# Patient Record
Sex: Female | Born: 1950 | Race: White | Hispanic: No | Marital: Married | Smoking: Former smoker
Health system: Southern US, Community
[De-identification: ages and names within clinical notes are randomized; demographics above are authoritative.]

## PROBLEM LIST (undated history)

## (undated) DIAGNOSIS — E119 Type 2 diabetes mellitus without complications: Secondary | ICD-10-CM

## (undated) DIAGNOSIS — R7303 Prediabetes: Secondary | ICD-10-CM

## (undated) HISTORY — DX: Prediabetes: R73.03

## (undated) HISTORY — PX: REPLACEMENT TOTAL KNEE BILATERAL: SUR1225

## (undated) HISTORY — PX: ABDOMINAL HYSTERECTOMY: SHX81

---

## 2004-07-10 ENCOUNTER — Ambulatory Visit: Payer: Self-pay | Admitting: Unknown Physician Specialty

## 2006-02-19 ENCOUNTER — Ambulatory Visit: Payer: Self-pay | Admitting: Unknown Physician Specialty

## 2009-09-13 ENCOUNTER — Ambulatory Visit: Payer: Self-pay | Admitting: Gastroenterology

## 2010-09-03 ENCOUNTER — Ambulatory Visit: Payer: Self-pay | Admitting: General Practice

## 2010-09-19 ENCOUNTER — Inpatient Hospital Stay: Payer: Self-pay | Admitting: General Practice

## 2011-01-10 ENCOUNTER — Ambulatory Visit: Payer: Self-pay | Admitting: Unknown Physician Specialty

## 2011-01-29 ENCOUNTER — Ambulatory Visit: Payer: Self-pay | Admitting: General Practice

## 2011-02-13 ENCOUNTER — Inpatient Hospital Stay: Payer: Self-pay | Admitting: General Practice

## 2011-11-22 IMAGING — CR DG KNEE 1-2V*L*
1 series · 2 of 2 positions shown · non-contrast
Comparison: none

REASON FOR EXAM: postop
COMMENTS:   Bedside (portable):Y

PROCEDURE:     DXR - DXR KNEE LEFT AP AND LATERAL  - February 13, 2011 [DATE]
RESULT:     The patient is status post left knee arthroplasty. Surgical
drains and skin staples are present. There is no evidence of postoperative
bone or hardware complication.

[Series 1: view not recorded · 0.17mm/px · 2 of 2 slices shown]
[im 1/2]
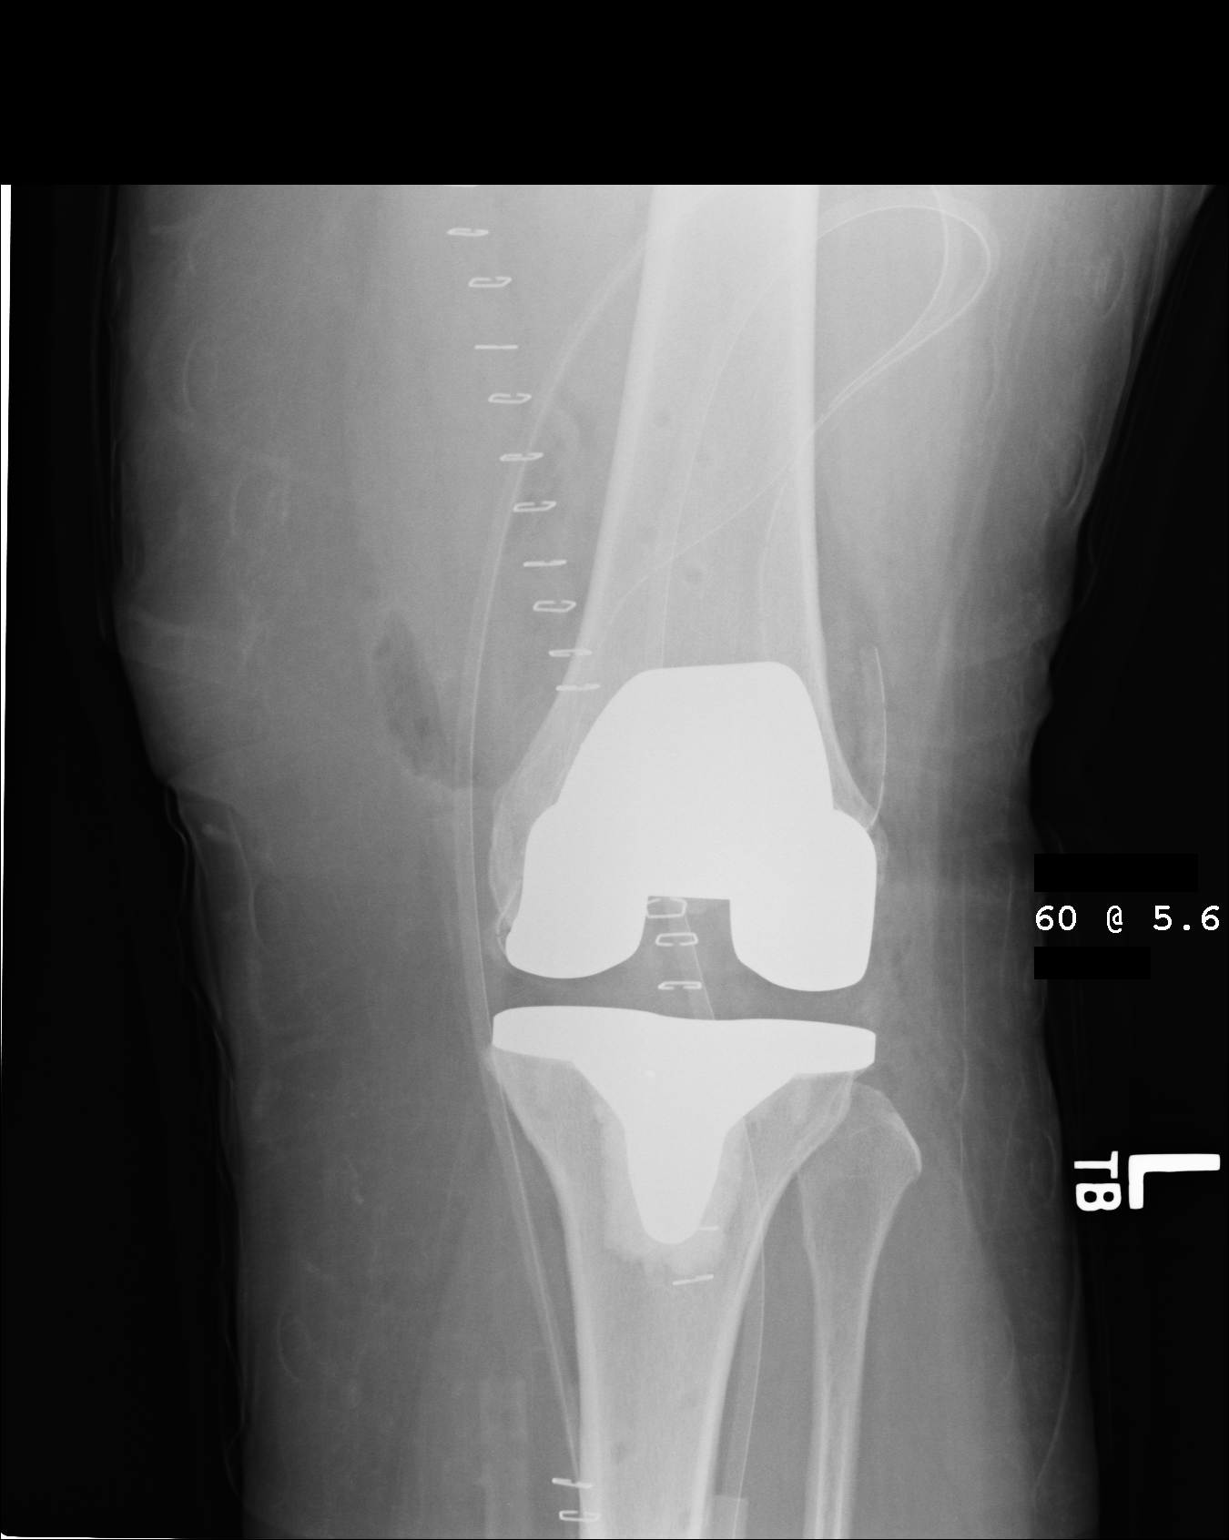
[im 2/2]
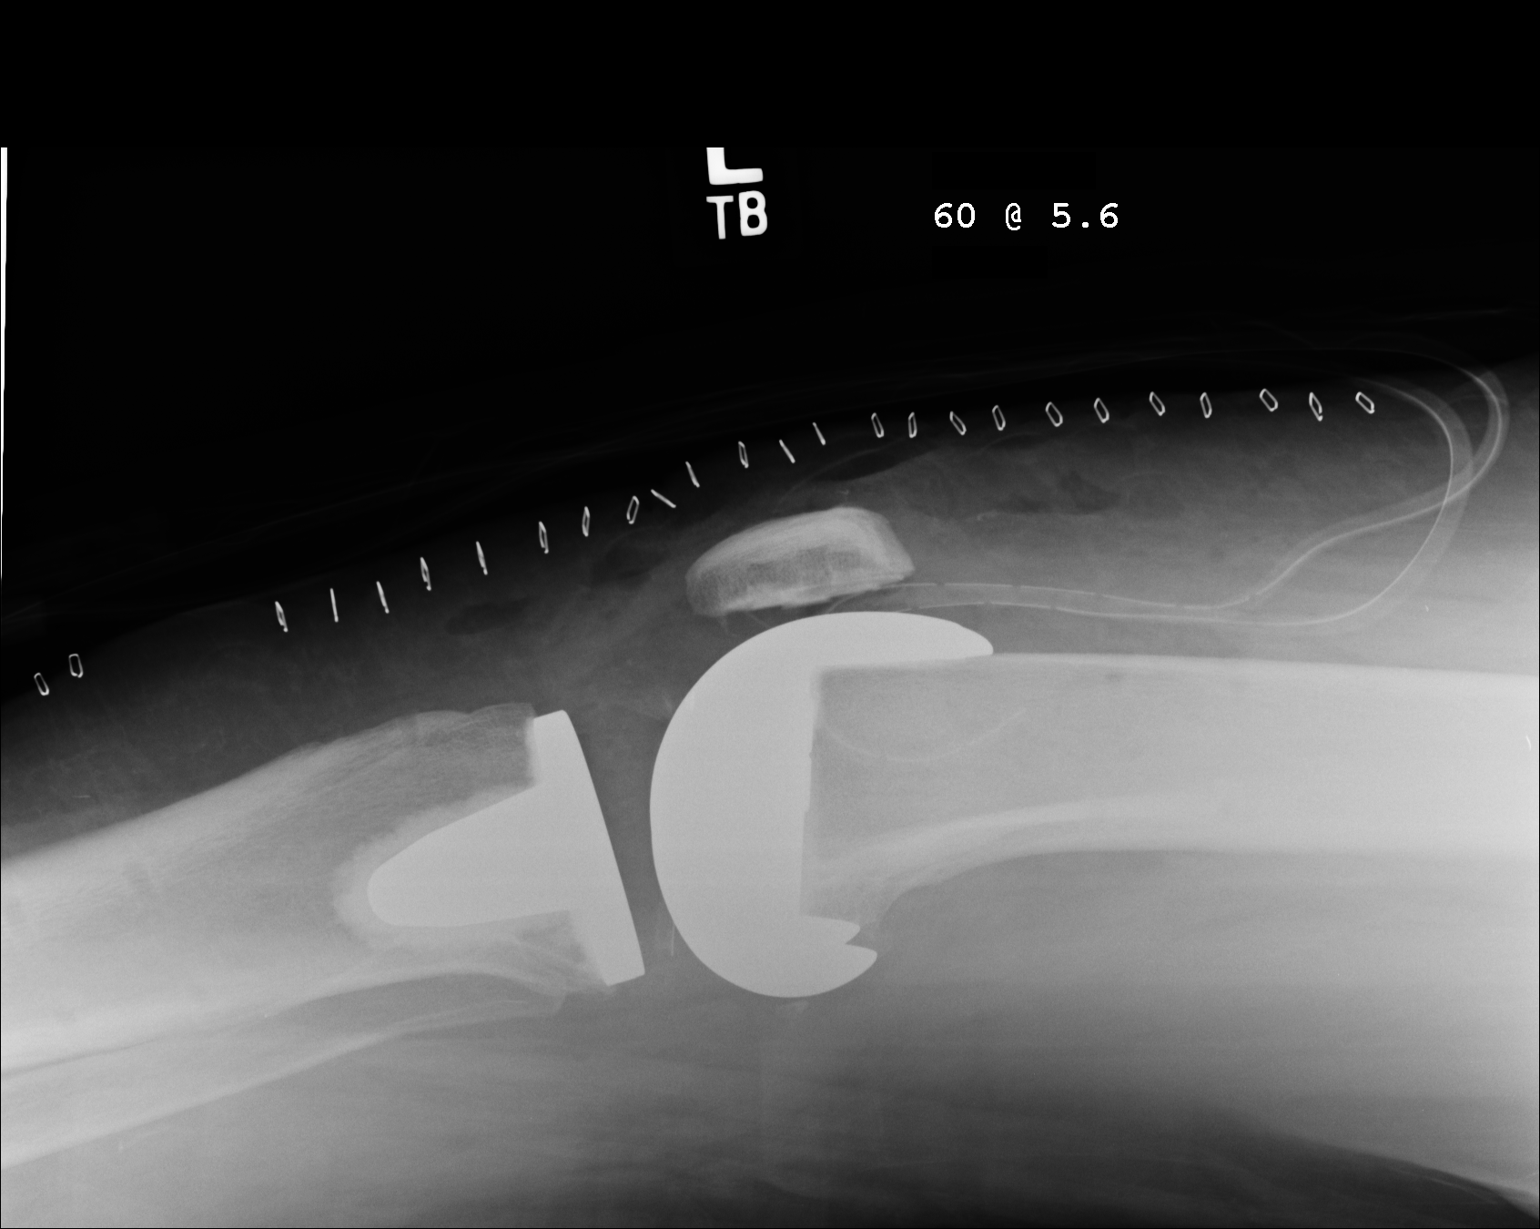

[2 of 2 positions shown; findings below may reference images not displayed]

IMPRESSION: Please see above.

## 2013-12-29 ENCOUNTER — Ambulatory Visit: Payer: Self-pay | Admitting: Family Medicine

## 2015-09-10 ENCOUNTER — Emergency Department: Payer: No Typology Code available for payment source

## 2015-09-10 ENCOUNTER — Inpatient Hospital Stay (HOSPITAL_COMMUNITY)
Admission: AD | Admit: 2015-09-10 | Discharge: 2015-09-14 | DRG: 083 | Disposition: A | Discharge status: Home Health | Payer: No Typology Code available for payment source | Source: Other Acute Inpatient Hospital | Attending: Neurosurgery | Admitting: Neurosurgery

## 2015-09-10 ENCOUNTER — Encounter: Payer: Self-pay | Admitting: Emergency Medicine

## 2015-09-10 ENCOUNTER — Emergency Department
Admission: EM | Admit: 2015-09-10 | Discharge: 2015-09-10 | Disposition: A | Discharge status: Home / Self Care | Payer: No Typology Code available for payment source | Attending: Emergency Medicine | Admitting: Emergency Medicine

## 2015-09-10 DIAGNOSIS — S069X1A Unspecified intracranial injury with loss of consciousness of 30 minutes or less, initial encounter: Secondary | ICD-10-CM

## 2015-09-10 DIAGNOSIS — N179 Acute kidney failure, unspecified: Secondary | ICD-10-CM | POA: Insufficient documentation

## 2015-09-10 DIAGNOSIS — W102XXA Fall (on)(from) incline, initial encounter: Secondary | ICD-10-CM | POA: Diagnosis present

## 2015-09-10 DIAGNOSIS — E119 Type 2 diabetes mellitus without complications: Secondary | ICD-10-CM | POA: Diagnosis present

## 2015-09-10 DIAGNOSIS — R001 Bradycardia, unspecified: Secondary | ICD-10-CM | POA: Diagnosis present

## 2015-09-10 DIAGNOSIS — S069XAA Unspecified intracranial injury with loss of consciousness status unknown, initial encounter: Secondary | ICD-10-CM | POA: Diagnosis present

## 2015-09-10 DIAGNOSIS — S06369A Traumatic hemorrhage of cerebrum, unspecified, with loss of consciousness of unspecified duration, initial encounter: Principal | ICD-10-CM | POA: Diagnosis present

## 2015-09-10 DIAGNOSIS — D72829 Elevated white blood cell count, unspecified: Secondary | ICD-10-CM | POA: Insufficient documentation

## 2015-09-10 DIAGNOSIS — R4701 Aphasia: Secondary | ICD-10-CM | POA: Diagnosis present

## 2015-09-10 DIAGNOSIS — Z96653 Presence of artificial knee joint, bilateral: Secondary | ICD-10-CM | POA: Diagnosis present

## 2015-09-10 DIAGNOSIS — E1169 Type 2 diabetes mellitus with other specified complication: Secondary | ICD-10-CM | POA: Insufficient documentation

## 2015-09-10 DIAGNOSIS — Z96651 Presence of right artificial knee joint: Secondary | ICD-10-CM | POA: Insufficient documentation

## 2015-09-10 DIAGNOSIS — Y999 Unspecified external cause status: Secondary | ICD-10-CM | POA: Insufficient documentation

## 2015-09-10 DIAGNOSIS — Z794 Long term (current) use of insulin: Secondary | ICD-10-CM | POA: Diagnosis not present

## 2015-09-10 DIAGNOSIS — Z7984 Long term (current) use of oral hypoglycemic drugs: Secondary | ICD-10-CM | POA: Diagnosis not present

## 2015-09-10 DIAGNOSIS — S0636AA Traumatic hemorrhage of cerebrum, unspecified, with loss of consciousness status unknown, initial encounter: Secondary | ICD-10-CM | POA: Insufficient documentation

## 2015-09-10 DIAGNOSIS — S069X9A Unspecified intracranial injury with loss of consciousness of unspecified duration, initial encounter: Secondary | ICD-10-CM

## 2015-09-10 DIAGNOSIS — Y939 Activity, unspecified: Secondary | ICD-10-CM | POA: Insufficient documentation

## 2015-09-10 DIAGNOSIS — S062X1A Diffuse traumatic brain injury with loss of consciousness of 30 minutes or less, initial encounter: Secondary | ICD-10-CM | POA: Diagnosis not present

## 2015-09-10 DIAGNOSIS — W108XXA Fall (on) (from) other stairs and steps, initial encounter: Secondary | ICD-10-CM | POA: Insufficient documentation

## 2015-09-10 DIAGNOSIS — Y92094 Garage of other non-institutional residence as the place of occurrence of the external cause: Secondary | ICD-10-CM | POA: Diagnosis not present

## 2015-09-10 DIAGNOSIS — Z96652 Presence of left artificial knee joint: Secondary | ICD-10-CM | POA: Diagnosis not present

## 2015-09-10 DIAGNOSIS — S06351D Traumatic hemorrhage of left cerebrum with loss of consciousness of 30 minutes or less, subsequent encounter: Secondary | ICD-10-CM | POA: Diagnosis not present

## 2015-09-10 DIAGNOSIS — E669 Obesity, unspecified: Secondary | ICD-10-CM | POA: Insufficient documentation

## 2015-09-10 DIAGNOSIS — I619 Nontraumatic intracerebral hemorrhage, unspecified: Secondary | ICD-10-CM

## 2015-09-10 HISTORY — DX: Type 2 diabetes mellitus without complications: E11.9

## 2015-09-10 LAB — MRSA PCR SCREENING: MRSA by PCR: NEGATIVE

## 2015-09-10 LAB — CBC
HEMATOCRIT: 38.1 % (ref 36.0–46.0)
HEMOGLOBIN: 12.6 g/dL (ref 12.0–15.0)
MCH: 26.5 pg (ref 26.0–34.0)
MCHC: 33.1 g/dL (ref 30.0–36.0)
MCV: 80.2 fL (ref 78.0–100.0)
Platelets: 250 10*3/uL (ref 150–400)
RBC: 4.75 MIL/uL (ref 3.87–5.11)
RDW: 14.7 % (ref 11.5–15.5)
WBC: 13.3 10*3/uL — ABNORMAL HIGH (ref 4.0–10.5)

## 2015-09-10 LAB — BASIC METABOLIC PANEL
ANION GAP: 16 — AB (ref 5–15)
BUN: 19 mg/dL (ref 6–20)
CALCIUM: 9.1 mg/dL (ref 8.9–10.3)
CO2: 21 mmol/L — AB (ref 22–32)
Chloride: 99 mmol/L — ABNORMAL LOW (ref 101–111)
Creatinine, Ser: 1.22 mg/dL — ABNORMAL HIGH (ref 0.44–1.00)
GFR, EST AFRICAN AMERICAN: 53 mL/min — AB (ref >60)
GFR, EST NON AFRICAN AMERICAN: 46 mL/min — AB (ref >60)
GLUCOSE: 170 mg/dL — AB (ref 65–99)
POTASSIUM: 3.5 mmol/L (ref 3.5–5.1)
Sodium: 136 mmol/L (ref 135–145)

## 2015-09-10 MED ORDER — ACETAMINOPHEN 325 MG PO TABS: 650.0000 mg | ORAL_TABLET | Freq: Four times a day (QID) | ORAL | Status: DC | PRN

## 2015-09-10 MED ORDER — POTASSIUM CHLORIDE IN NACL 20-0.9 MEQ/L-% IV SOLN
INTRAVENOUS | Status: DC
  Administered 2015-09-10 – 2015-09-11 (×2): via INTRAVENOUS
  Filled 2015-09-10 (×3): qty 1000

## 2015-09-10 MED ORDER — TETANUS-DIPHTH-ACELL PERTUSSIS 5-2.5-18.5 LF-MCG/0.5 IM SUSP
0.5000 mL | Freq: Once | INTRAMUSCULAR | Status: AC
  Administered 2015-09-10: 0.5 mL via INTRAMUSCULAR
  Filled 2015-09-10: qty 0.5

## 2015-09-10 MED ORDER — SODIUM CHLORIDE 0.9% FLUSH
3.0000 mL | Freq: Two times a day (BID) | INTRAVENOUS | Status: DC
  Administered 2015-09-10: 6 mL via INTRAVENOUS
  Administered 2015-09-11: 3 mL via INTRAVENOUS
  Administered 2015-09-12: 6 mL via INTRAVENOUS
  Administered 2015-09-12 – 2015-09-14 (×3): 3 mL via INTRAVENOUS

## 2015-09-10 MED ORDER — ACETAMINOPHEN 650 MG RE SUPP: 650.0000 mg | Freq: Four times a day (QID) | RECTAL | Status: DC | PRN

## 2015-09-10 MED ORDER — IBUPROFEN 800 MG PO TABS
800.0000 mg | ORAL_TABLET | Freq: Once | ORAL | Status: AC
  Administered 2015-09-10: 800 mg via ORAL
  Filled 2015-09-10: qty 1

## 2015-09-10 MED ORDER — LIDOCAINE-EPINEPHRINE (PF) 1 %-1:200000 IJ SOLN
INTRAMUSCULAR | Status: AC
  Administered 2015-09-10: 16:00:00
  Filled 2015-09-10: qty 30

## 2015-09-10 MED ORDER — CEFAZOLIN SODIUM 1-5 GM-% IV SOLN
1.0000 g | Freq: Once | INTRAVENOUS | Status: DC
  Filled 2015-09-10 (×2): qty 50

## 2015-09-10 MED ORDER — ONDANSETRON 4 MG PO TBDP
4.0000 mg | ORAL_TABLET | Freq: Once | ORAL | Status: AC
  Administered 2015-09-10: 4 mg via ORAL
  Filled 2015-09-10: qty 1

## 2015-09-10 MED ORDER — ONDANSETRON HCL 4 MG/2ML IJ SOLN: 4.0000 mg | Freq: Four times a day (QID) | INTRAMUSCULAR | Status: DC | PRN

## 2015-09-10 MED ORDER — HYDROCODONE-ACETAMINOPHEN 5-325 MG PO TABS
1.0000 | ORAL_TABLET | ORAL | Status: DC | PRN
  Administered 2015-09-11 – 2015-09-13 (×7): 1 via ORAL
  Filled 2015-09-10: qty 2
  Filled 2015-09-10 (×6): qty 1

## 2015-09-10 MED ORDER — OXYCODONE-ACETAMINOPHEN 5-325 MG PO TABS
1.0000 | ORAL_TABLET | Freq: Once | ORAL | Status: AC
  Administered 2015-09-10: 1 via ORAL
  Filled 2015-09-10: qty 1

## 2015-09-10 MED ORDER — LIDOCAINE-EPINEPHRINE (PF) 1 %-1:200000 IJ SOLN
30.0000 mL | Freq: Once | INTRAMUSCULAR | Status: AC
  Administered 2015-09-10: 30 mL
  Filled 2015-09-10: qty 30

## 2015-09-10 MED ORDER — HYDROMORPHONE HCL 1 MG/ML IJ SOLN: 0.5000 mg | INTRAMUSCULAR | Status: DC | PRN

## 2015-09-10 MED ORDER — ONDANSETRON HCL 4 MG PO TABS: 4.0000 mg | ORAL_TABLET | Freq: Four times a day (QID) | ORAL | Status: DC | PRN

## 2015-09-10 NOTE — ED Provider Notes (Signed)
[facility] Emergency Department Provider Note  ____________________________________________  Time seen: Approximately 3:20 PM  I have reviewed the triage vital signs and the nursing notes.   HISTORY  Chief Complaint Fall    HPI Anita Lee is a 65 y.o. female presents to the emergency room via EMS after a fall. She tripped going down stairs in her garage hitting her head on her car and injuring her shins. Her husband reported she then lost consciousness and fell backward hitting the back of her head. She has since the accident been alert without any atypical behaviors. She does report some dizziness, that is worse with movement. She has right jaw pain along with a headache. She also has pain to her shins. She denies any use of blood thinners.   Past Medical History  Diagnosis Date  . Diabetes mellitus without complication (HCC)     There are no active problems to display for this patient.   Past Surgical History  Procedure Laterality Date  . Replacement total knee bilateral      No current outpatient prescriptions on file.  Allergies Review of patient's allergies indicates no known allergies.  No family history on file.  Social History Social History  Substance Use Topics  . Smoking status: Never Smoker   . Smokeless tobacco: None  . Alcohol Use: No    Review of Systems Constitutional: No fever/chills Eyes: No visual changes. ENT: No sore throat. Cardiovascular: Denies chest pain. Respiratory: Denies shortness of breath. Musculoskeletal: Negative for back pain,She denies neck pain  Skin: per HPI Neurological: Negative for headaches, focal weakness or numbness. 10-point ROS otherwise negative.  ____________________________________________   PHYSICAL EXAM:  VITAL SIGNS: ED Triage Vitals  Enc Vitals Group     BP 09/10/15 1233 124/78 mmHg     Pulse Rate 09/10/15 1233 62     Resp 09/10/15 1233 18     Temp 09/10/15  1233 97.7 F (36.5 C)     Temp Source 09/10/15 1233 Oral     SpO2 09/10/15 1233 98 %     Weight 09/10/15 1233 185 lb (83.915 kg)     Height 09/10/15 1233 5\' 2"  (1.575 m)     Head Cir --      Peak Flow --      Pain Score 09/10/15 1235 7     Pain Loc --      Pain Edu? --      Excl. in GC? --     Constitutional: Alert and oriented. Well appearing and in no acute distress. Eyes: Conjunctivae are normal. PERRL. EOMI. Ears:  Clear with normal landmarks. No erythema. Head: Atraumatic. Nose: No congestion/rhinnorhea. Mouth/Throat: Mucous membranes are moist.  Oropharynx non-erythematous. No lesions. Neck:  Supple.  No adenopathy.  No cervical spine tenderness to palpation. Cardiovascular: Normal rate, regular rhythm. Grossly normal heart sounds.  Good peripheral circulation. Respiratory: Normal respiratory effort.  No retractions. Lungs CTAB. Gastrointestinal: Soft and nontender. No distention. Neurologic:  Normal speech and language. No gross focal neurologic deficits are appreciated. No gait instability. Skin:  1.5 cm laceration to the posterior scalp; she has a laceration to left shin that was not assessed prior to CT of the head. Also a superficial abrasion, laceration to the right shin.  Psychiatric: Mood and affect are normal. Speech and behavior are normal.  ____________________________________________   LABS (all labs ordered are listed, but only abnormal results are displayed)  Labs Reviewed - No data to display ____________________________________________  EKG  ____________________________________________  RADIOLOGY  CLINICAL DATA: Patient with traumatic head injury. Positive reported loss of consciousness. Patient tripped hit her head on the car. Laceration to the back of the head.  EXAM: CT HEAD WITHOUT CONTRAST  CT MAXILLOFACIAL WITHOUT CONTRAST  CT CERVICAL SPINE WITHOUT CONTRAST  TECHNIQUE: Multidetector CT imaging of the head, cervical spine,  and maxillofacial structures were performed using the standard protocol without intravenous contrast. Multiplanar CT image reconstructions of the cervical spine and maxillofacial structures were also generated.  COMPARISON: None.  FINDINGS: CT HEAD FINDINGS  There is a 3.3 x 2.3 cm acute hematoma within the left temporal lobe. Small focal high attenuation peripherally overlying the right posterior parietal lobe (image 19; series 2). No significant midline shift. Orbits are unremarkable. There is fluid within the mastoid air cells bilaterally, left-greater-than-right. Fluid within the maxillary sinuses bilaterally. Mucosal thickening involving the ethmoid air cells. Soft tissue swelling and hematoma formation overlying the right frontal calvarium and along the dorsal right calvarium. No underlying skull fracture.  CT MAXILLOFACIAL FINDINGS  Maxilla and mandible are intact. Nasal bone is intact. Zygomatic arches and pterygoid plates are intact. Fluid within the maxillary sinuses bilaterally. Opacification of the left-greater-than-right ethmoid air cells. No evidence for definite displaced skullbase fracture. Osseous demineralization of the skullbase.  CT CERVICAL SPINE FINDINGS  Reversal of the normal cervical lordosis. Grade 1 anterolisthesis of C3 on C4 likely secondary to degenerative facet disease at this level. C5-6 and C6-7 degenerative disc disease. Craniocervical junction is intact. Prevertebral soft tissues are unremarkable. Lung apices are clear. Visualized thyroid is unremarkable.  IMPRESSION: There is a 3.3 cm acute intraparenchymal hemorrhage within the left temporal lobe, potentially posttraumatic in etiology.  Small amount of high attenuation overlying the posterior right parietal lobe favored to represent a small amount of extra-axial blood products, potentially subarachnoid in etiology.  Opacification of the left-greater-than-right mastoid air  cells which is favored to be secondary to a chronic infectious or inflammatory process. No displaced skull fracture is visualized. Recommend clinical correlation to exclude the possibility of hemotympanum on the left. If there are blood products, this would be worrisome for nondisplaced skullbase fracture.  Degenerative changes of the cervical spine without evidence for acute fracture.  Critical Value/emergent results were called by telephone at the time of interpretation on 09/10/2015 at 2:46 pm to Dr. Ladona Ridgel, who verbally acknowledged these results.   Electronically Signed  By: Annia Belt M.D.  On: 09/10/2015 14:58 ____________________________________________   PROCEDURES  Procedure(s) performed: None  Critical Care performed: No  ____________________________________________   INITIAL IMPRESSION / ASSESSMENT AND PLAN / ED COURSE  Pertinent labs & imaging results that were available during my care of the patient were reviewed by me and considered in my medical decision making (see chart for details).  65 year old female who was brought to flex after a fall with head injury. After initial assessment, CT of the head was ordered showing an intraparenchymal bleed in the temporal lobe. At this point patient was transferred to [facility] side, room 25. ____________________________________________   FINAL CLINICAL IMPRESSION(S) / ED DIAGNOSES  Final diagnoses:  None      Ignacia Bayley, PA-C 09/10/15 1529  Jene Every, MD 09/10/15 1549

## 2015-09-10 NOTE — ED Provider Notes (Signed)
Note below was reviewed. Patient was seen and examined history is exactly the same she gave to me herself. Patient reports brief loss of consciousness she says she has a mild headache no neck pain and nothing else hurts except for her shins bilaterally where she has some lacerations. She has some bleeding on the back of her head. On examination there is a one-inch laceration over the occiput this was anesthetized with 1% lidocaine skin was cleaned with Betadine wound was irrigated no foreign bodies were seen was closed with 4 staples. Bilateral tib-fib x-rays were done that showed no fractures there is some small amount of what appears to be tiny grits foreign bodies right of the skin these wounds were anesthetized with 1% lidocaine skin was cleaned with Betadine wound was irrigated with copious amounts of saline and some small gritty material was removed from the left laceration which is about 2 inches long and down to the bone in 1 spot wound was reirrigated deep tissues were closed with 4 stitches of 3-0 nylon undyed antibacterial and then the skin was closed with 5 stitches of 3-0 nylon black patient tolerated well the right leg was also anesthetized with 1% lidocaine with epi skin was cleaned wound was irrigated I did not see anything more than one little piece of foreign body in that wound was reviewed that was removed was reirrigated again and closed with 1 stitch of 3-0 nylon some superficial skin tears will be closed by the nurse with some Steri-Strips these were also cleaned and irrigated. Patient tolerated all this very well ambulance is on the way to take the patient to [facility]surgery [facility] where she has been admitted should say patient had no other head tenderness and tenderness chest tenderness abdominal tenderness arm tenderness or leg tenderness except for the lacerations. Was able to move all Achilles equally and well even after the wounds were repaired. Patient is currently stable awaiting  transport.  Arnaldo NatalPaul F Malinda, MD 09/10/15 [phone]

## 2015-09-10 NOTE — ED Notes (Signed)
EMS reports cannot transport pt with antibiotic.

## 2015-09-10 NOTE — ED Notes (Signed)
See informed PA that she fainted after hitting head on car  Questionable LOC

## 2015-09-10 NOTE — H&P (Signed)
Anita ArbourDebra Corenne Lee is an 65 y.o. female.   Chief Complaint: Traumatic brain injury HPI: 65 year old female fell while going upstairs striking her for head. Patient helped to her feet when she then fell backwards striking the back of her head. None of these falls cause a loss of consciousness however the patient was stunned and not acting appropriately. Patient taken to local emergency room. Head CT scan demonstrated a left posterior temporal hemorrhage. Patient transferred for additional care. No history of seizure. No history of numbness, paresthesias or weakness.  Past Medical History  Diagnosis Date  . Diabetes mellitus without complication [facility](HCC)     Past Surgical History  Procedure Laterality Date  . Replacement total knee bilateral      No family history on file. Social History:  reports that she has never smoked. She does not have any smokeless tobacco history on file. She reports that she does not drink alcohol. Her drug history is not on file.  Allergies: No Known Allergies  No prescriptions prior to admission    No results found for this or any previous visit (from the past 48 hour(s)). Dg Tibia/fibula Left  09/10/2015  CLINICAL DATA:  Patient status post fall in the dry age. Lower extremity pain. EXAM: LEFT TIBIA AND FIBULA - 2 VIEW COMPARISON:  Left knee radiograph 02/13/2011 FINDINGS: Patient status post left knee arthroplasty. Hardware appears intact. Normal anatomic alignment. No evidence for acute fracture or dislocation. IMPRESSION: No acute osseous abnormality. Electronically Signed   By: Annia Beltrew  Davis M.D.   On: 09/10/2015 15:45   Dg Tibia/fibula Right  09/10/2015  CLINICAL DATA:  Fall with bilateral lower extremity pain and lacerations EXAM: RIGHT TIBIA AND FIBULA - 2 VIEW COMPARISON:  09/19/2010 right knee radiograph FINDINGS: Status post right total knee arthroplasty, with well-positioned right distal femoral and right proximal tibial prostheses, with no evidence  of hardware fracture or loosening. Small soft tissue laceration anterior to the right proximal tibial shaft. No radiopaque foreign body. No osseous fracture or focal osseous lesion. IMPRESSION: 1. Small soft tissue laceration anterior to the proximal right tibial shaft. No osseous fracture. 2. Right total knee arthroplasty, with no hardware complication. Electronically Signed   By: Delbert PhenixJason A Poff M.D.   On: 09/10/2015 15:47   Ct Head Wo Contrast  09/10/2015  CLINICAL DATA:  Patient with traumatic head injury. Positive reported loss of consciousness. Patient tripped hit her head on the car. Laceration to the back of the head. EXAM: CT HEAD WITHOUT CONTRAST CT MAXILLOFACIAL WITHOUT CONTRAST CT CERVICAL SPINE WITHOUT CONTRAST TECHNIQUE: Multidetector CT imaging of the head, cervical spine, and maxillofacial structures were performed using the standard protocol without intravenous contrast. Multiplanar CT image reconstructions of the cervical spine and maxillofacial structures were also generated. COMPARISON:  None. FINDINGS: CT HEAD FINDINGS There is a 3.3 x 2.3 cm acute hematoma within the left temporal lobe. Small focal high attenuation peripherally overlying the right posterior parietal lobe (image 19; series 2). No significant midline shift. Orbits are unremarkable. There is fluid within the mastoid air cells bilaterally, left-greater-than-right. Fluid within the maxillary sinuses bilaterally. Mucosal thickening involving the ethmoid air cells. Soft tissue swelling and hematoma formation overlying the right frontal calvarium and along the dorsal right calvarium. No underlying skull fracture. CT MAXILLOFACIAL FINDINGS Maxilla and mandible are intact. Nasal bone is intact. Zygomatic arches and pterygoid plates are intact. Fluid within the maxillary sinuses bilaterally. Opacification of the left-greater-than-right ethmoid air cells. No evidence for definite displaced skullbase fracture. Osseous  demineralization of  the skullbase. CT CERVICAL SPINE FINDINGS Reversal of the normal cervical lordosis. Grade 1 anterolisthesis of C3 on C4 likely secondary to degenerative facet disease at this level. C5-6 and C6-7 degenerative disc disease. Craniocervical junction is intact. Prevertebral soft tissues are unremarkable. Lung apices are clear. Visualized thyroid is unremarkable. IMPRESSION: There is a 3.3 cm acute intraparenchymal hemorrhage within the left temporal lobe, potentially posttraumatic in etiology. Small amount of high attenuation overlying the posterior right parietal lobe favored to represent a small amount of extra-axial blood products, potentially subarachnoid in etiology. Opacification of the left-greater-than-right mastoid air cells which is favored to be secondary to a chronic infectious or inflammatory process. No displaced skull fracture is visualized. Recommend clinical correlation to exclude the possibility of hemotympanum on the left. If there are blood products, this would be worrisome for nondisplaced skullbase fracture. Degenerative changes of the cervical spine without evidence for acute fracture. Critical Value/emergent results were called by telephone at the time of interpretation on 09/10/2015 at 2:46 pm to Dr. Ladona Ridgel, who verbally acknowledged these results. Electronically Signed   By: Annia Belt M.D.   On: 09/10/2015 14:58   Ct Cervical Spine Wo Contrast  09/10/2015  CLINICAL DATA:  Patient with traumatic head injury. Positive reported loss of consciousness. Patient tripped hit her head on the car. Laceration to the back of the head. EXAM: CT HEAD WITHOUT CONTRAST CT MAXILLOFACIAL WITHOUT CONTRAST CT CERVICAL SPINE WITHOUT CONTRAST TECHNIQUE: Multidetector CT imaging of the head, cervical spine, and maxillofacial structures were performed using the standard protocol without intravenous contrast. Multiplanar CT image reconstructions of the cervical spine and maxillofacial structures were also  generated. COMPARISON:  None. FINDINGS: CT HEAD FINDINGS There is a 3.3 x 2.3 cm acute hematoma within the left temporal lobe. Small focal high attenuation peripherally overlying the right posterior parietal lobe (image 19; series 2). No significant midline shift. Orbits are unremarkable. There is fluid within the mastoid air cells bilaterally, left-greater-than-right. Fluid within the maxillary sinuses bilaterally. Mucosal thickening involving the ethmoid air cells. Soft tissue swelling and hematoma formation overlying the right frontal calvarium and along the dorsal right calvarium. No underlying skull fracture. CT MAXILLOFACIAL FINDINGS Maxilla and mandible are intact. Nasal bone is intact. Zygomatic arches and pterygoid plates are intact. Fluid within the maxillary sinuses bilaterally. Opacification of the left-greater-than-right ethmoid air cells. No evidence for definite displaced skullbase fracture. Osseous demineralization of the skullbase. CT CERVICAL SPINE FINDINGS Reversal of the normal cervical lordosis. Grade 1 anterolisthesis of C3 on C4 likely secondary to degenerative facet disease at this level. C5-6 and C6-7 degenerative disc disease. Craniocervical junction is intact. Prevertebral soft tissues are unremarkable. Lung apices are clear. Visualized thyroid is unremarkable. IMPRESSION: There is a 3.3 cm acute intraparenchymal hemorrhage within the left temporal lobe, potentially posttraumatic in etiology. Small amount of high attenuation overlying the posterior right parietal lobe favored to represent a small amount of extra-axial blood products, potentially subarachnoid in etiology. Opacification of the left-greater-than-right mastoid air cells which is favored to be secondary to a chronic infectious or inflammatory process. No displaced skull fracture is visualized. Recommend clinical correlation to exclude the possibility of hemotympanum on the left. If there are blood products, this would be  worrisome for nondisplaced skullbase fracture. Degenerative changes of the cervical spine without evidence for acute fracture. Critical Value/emergent results were called by telephone at the time of interpretation on 09/10/2015 at 2:46 pm to Dr. Ladona Ridgel, who verbally acknowledged these results. Electronically Signed  By: Annia Belt M.D.   On: 09/10/2015 14:58   Ct Maxillofacial Wo Cm  09/10/2015  CLINICAL DATA:  Patient with traumatic head injury. Positive reported loss of consciousness. Patient tripped hit her head on the car. Laceration to the back of the head. EXAM: CT HEAD WITHOUT CONTRAST CT MAXILLOFACIAL WITHOUT CONTRAST CT CERVICAL SPINE WITHOUT CONTRAST TECHNIQUE: Multidetector CT imaging of the head, cervical spine, and maxillofacial structures were performed using the standard protocol without intravenous contrast. Multiplanar CT image reconstructions of the cervical spine and maxillofacial structures were also generated. COMPARISON:  None. FINDINGS: CT HEAD FINDINGS There is a 3.3 x 2.3 cm acute hematoma within the left temporal lobe. Small focal high attenuation peripherally overlying the right posterior parietal lobe (image 19; series 2). No significant midline shift. Orbits are unremarkable. There is fluid within the mastoid air cells bilaterally, left-greater-than-right. Fluid within the maxillary sinuses bilaterally. Mucosal thickening involving the ethmoid air cells. Soft tissue swelling and hematoma formation overlying the right frontal calvarium and along the dorsal right calvarium. No underlying skull fracture. CT MAXILLOFACIAL FINDINGS Maxilla and mandible are intact. Nasal bone is intact. Zygomatic arches and pterygoid plates are intact. Fluid within the maxillary sinuses bilaterally. Opacification of the left-greater-than-right ethmoid air cells. No evidence for definite displaced skullbase fracture. Osseous demineralization of the skullbase. CT CERVICAL SPINE FINDINGS Reversal of the  normal cervical lordosis. Grade 1 anterolisthesis of C3 on C4 likely secondary to degenerative facet disease at this level. C5-6 and C6-7 degenerative disc disease. Craniocervical junction is intact. Prevertebral soft tissues are unremarkable. Lung apices are clear. Visualized thyroid is unremarkable. IMPRESSION: There is a 3.3 cm acute intraparenchymal hemorrhage within the left temporal lobe, potentially posttraumatic in etiology. Small amount of high attenuation overlying the posterior right parietal lobe favored to represent a small amount of extra-axial blood products, potentially subarachnoid in etiology. Opacification of the left-greater-than-right mastoid air cells which is favored to be secondary to a chronic infectious or inflammatory process. No displaced skull fracture is visualized. Recommend clinical correlation to exclude the possibility of hemotympanum on the left. If there are blood products, this would be worrisome for nondisplaced skullbase fracture. Degenerative changes of the cervical spine without evidence for acute fracture. Critical Value/emergent results were called by telephone at the time of interpretation on 09/10/2015 at 2:46 pm to Dr. Ladona Ridgel, who verbally acknowledged these results. Electronically Signed   By: Annia Belt M.D.   On: 09/10/2015 14:58    Pertinent items noted in HPI and remainder of comprehensive ROS otherwise negative.  There were no vitals taken for this visit.  The patient is awake and alert. She is disoriented to time place and situation however her level of orientation is complicated by her profound fluent aphasia. Patient appears reasonable and does understand questions when answered. She follows commands readily area examination of her cranial nerve function is normal bilaterally. Examination of her motor function of her extremities is normal bilaterally. There is no evidence of pronator drift. There is no evidence of sensory abnormality. There is no evidence  of long track signs. Examination head ears eyes nose and throat demonstrates evidence of frontal bruising and occipital bruising. There is no evidence of bony abnormality. Nasopharynx, the oropharynx, external auditory canals negative bilaterally. Neck supple. No evidence of bony abnormality. Chest and abdomen are benign. Extremities have bilateral anterior leg lacerations which were closed at the outside hospital. Otherwise free from injury or deformity. 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 Plan ICU  observation. Repeat CT scan tomorrow. No indication for surgical evacuation at present.  Anita Lee A 09/10/2015, 5:40 PM

## 2015-09-10 NOTE — ED Notes (Signed)
Neuro changes noted. Pt can state name. Unable to state year. Pt having expressive aphasia.

## 2015-09-10 NOTE — ED Notes (Signed)
Brought in via ems from home s/p fall  States she tripped in garage  Hit her head on the car  hematoma to forehead  Laceration to back of head  Laceration note to left lower leg  And abrasion to right lower leg

## 2015-09-10 NOTE — Plan of Care (Signed)
Problem: Consults Goal: Skin Care Protocol Initiated - if Braden Score 18 or less If consults are not indicated, leave blank or document N/A Outcome: Not Applicable Date Met:  68/40/33 Braden Score 21

## 2015-09-10 NOTE — ED Notes (Signed)
Stitches to bilateral legs. Steri strips placed over skin flaps on right leg.

## 2015-09-11 ENCOUNTER — Encounter (HOSPITAL_COMMUNITY): Payer: Self-pay | Admitting: Radiology

## 2015-09-11 ENCOUNTER — Inpatient Hospital Stay (HOSPITAL_COMMUNITY): Payer: No Typology Code available for payment source

## 2015-09-11 LAB — CBC
HEMATOCRIT: 37.3 % (ref 36.0–46.0)
Hemoglobin: 12 g/dL (ref 12.0–15.0)
MCH: 25.8 pg — ABNORMAL LOW (ref 26.0–34.0)
MCHC: 32.2 g/dL (ref 30.0–36.0)
MCV: 80.2 fL (ref 78.0–100.0)
PLATELETS: 271 10*3/uL (ref 150–400)
RBC: 4.65 MIL/uL (ref 3.87–5.11)
RDW: 14.6 % (ref 11.5–15.5)
WBC: 10.8 10*3/uL — ABNORMAL HIGH (ref 4.0–10.5)

## 2015-09-11 LAB — BASIC METABOLIC PANEL
ANION GAP: 12 (ref 5–15)
BUN: 17 mg/dL (ref 6–20)
CHLORIDE: 101 mmol/L (ref 101–111)
CO2: 26 mmol/L (ref 22–32)
Calcium: 9 mg/dL (ref 8.9–10.3)
Creatinine, Ser: 1.13 mg/dL — ABNORMAL HIGH (ref 0.44–1.00)
GFR, EST AFRICAN AMERICAN: 58 mL/min — AB (ref >60)
GFR, EST NON AFRICAN AMERICAN: 50 mL/min — AB (ref >60)
Glucose, Bld: 102 mg/dL — ABNORMAL HIGH (ref 65–99)
POTASSIUM: 3.5 mmol/L (ref 3.5–5.1)
SODIUM: 139 mmol/L (ref 135–145)

## 2015-09-11 NOTE — Progress Notes (Signed)
No new events or problems overnight. 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  Afebrile. Vitals are stable. Awake and alert. Fluent aphasia persists. Orientation difficult to measure secondary to aphasia. She appears to understand complex questions. She is pleasant and does not seem concerned about her lack of proper speech.  Motor 5/5 bilaterally with no pronator drift. Sensory exam intact.  Follow-up CT scan demonstrates enlargement of her left temporal hemorrhage. There is still no evidence of mass effect. I suspect this enlargement of the hemorrhage had occurred prior to her transfer to: Hospital as her neurological exam is stable from where it was last night. Continue ICU observation. [place].

## 2015-09-11 NOTE — Progress Notes (Signed)
Utilization review completed.  

## 2015-09-12 ENCOUNTER — Inpatient Hospital Stay (HOSPITAL_COMMUNITY): Payer: No Typology Code available for payment source

## 2015-09-12 DIAGNOSIS — E669 Obesity, unspecified: Secondary | ICD-10-CM

## 2015-09-12 DIAGNOSIS — S06351D Traumatic hemorrhage of left cerebrum with loss of consciousness of 30 minutes or less, subsequent encounter: Secondary | ICD-10-CM

## 2015-09-12 DIAGNOSIS — N179 Acute kidney failure, unspecified: Secondary | ICD-10-CM | POA: Insufficient documentation

## 2015-09-12 DIAGNOSIS — D72829 Elevated white blood cell count, unspecified: Secondary | ICD-10-CM | POA: Insufficient documentation

## 2015-09-12 DIAGNOSIS — Z96651 Presence of right artificial knee joint: Secondary | ICD-10-CM | POA: Insufficient documentation

## 2015-09-12 DIAGNOSIS — E119 Type 2 diabetes mellitus without complications: Secondary | ICD-10-CM

## 2015-09-12 DIAGNOSIS — I619 Nontraumatic intracerebral hemorrhage, unspecified: Secondary | ICD-10-CM | POA: Insufficient documentation

## 2015-09-12 DIAGNOSIS — E1169 Type 2 diabetes mellitus with other specified complication: Secondary | ICD-10-CM | POA: Insufficient documentation

## 2015-09-12 DIAGNOSIS — Z96652 Presence of left artificial knee joint: Secondary | ICD-10-CM | POA: Insufficient documentation

## 2015-09-12 DIAGNOSIS — R001 Bradycardia, unspecified: Secondary | ICD-10-CM

## 2015-09-12 NOTE — Consult Note (Signed)
Physical Medicine and Rehabilitation Consult  Reason for Consult: TBI Referring Physician: Dr. Jordan LikesPool   HPI:  Anita Lee is a 65 y.o. female with history of DMT2, OA s/p B-TKR who tripped while going down stairs on 09/10/15. She struck her head on the car then had brief LOC with fall  backwards where she struck the back of her head. She sustained lacerations to back of head and abrasions to bilateral shins and was reported to not be acting appropriately. She was evaluated in ED and posterior scalp laceration sutured. X rays bilateral tib/fib negative for fracture.  CT head demonstrated 3.3 cm acute IPH in let temporal lobe. She was evaluated by Dr. Jordan LikesPool who recommended serial CCT for follow up. Patient with nonfluent and fluent aphasia and therapy evaluations pending.  Follow up CCT with enlargement of bleed but has been neurologically stable. CIR recommended for follow up therapy.    Patient and husband retired in December 2016--he can provide 24 hours supervision after discharge.  Husband reports that she has walked with wide based gait for past 5 years since TKR.  He reports her verbal output has improved in past 24 hours and she is walking to the BR with SBA.    Review of Systems  Unable to perform ROS: medical condition      Past Medical History  Diagnosis Date  . Diabetes mellitus without complication [facility](HCC)     Past Surgical History  Procedure Laterality Date  . Replacement total knee bilateral      No family history on file.    Social History:  Married. Lives with husband and independent without AD.  She used to smoke 2 PPD till [id number].  She does not have any smokeless tobacco history on file. Per reports that she drinks a margarita once a week.   Her drug history is not on file.    Allergies: No Known Allergies    Medications Prior to Admission  Medication Sig Dispense Refill  . escitalopram (LEXAPRO) 10 MG tablet Take 10 mg by mouth daily.    .  metFORMIN (GLUCOPHAGE) 500 MG tablet Take 500 mg by mouth 2 (two) times daily with a meal.    . pravastatin (PRAVACHOL) 40 MG tablet Take 40 mg by mouth daily.    Marland Kitchen. triamterene-hydrochlorothiazide (DYAZIDE) 37.5-25 MG capsule Take 1 capsule by mouth daily.      Home: Home Living Family/patient expects to be discharged to:: Private residence Living Arrangements: Spouse/significant other  Functional History:   Functional Status:  Mobility:          ADL:    Cognition: Cognition Orientation Level: Oriented to person, Oriented to place, Oriented to situation    Blood pressure 132/72, pulse 64, temperature 98.3 F (36.8 C), temperature source Oral, resp. rate 16, height 5\' 2"  (1.575 m), weight 84.8 kg (186 lb 15.2 oz), SpO2 96 %. Physical Exam  Nursing note and vitals reviewed. Constitutional: She appears well-developed and well-nourished. No distress.  HENT:  Head: Normocephalic.  Mouth/Throat: Oropharynx is clear and moist.  Eyes: Conjunctivae and EOM are normal. Pupils are equal, round, and reactive to light.  Neck: Normal range of motion. Neck supple.  Cardiovascular: Normal rate and regular rhythm.   Respiratory: Effort normal and breath sounds normal. No stridor. No respiratory distress. She has no wheezes.  GI: Soft. Bowel sounds are normal. She exhibits no distension. There is no tenderness.  Musculoskeletal: She exhibits no edema or tenderness.  Well healed  old B-TKR incisions.   Neurological: She is alert.  Receptive and Expressive aphasia with occasional paraphrasia, with easy mental fatigue. She has decreased insight/awareness of deficits.  ? Sensation intact to light touch next DTRs symmetric Motor: 4+ to 5/5 throughout.  Skin: Skin is warm and dry. No rash noted. She is not diaphoretic. No erythema.  Psychiatric: She has a normal mood and affect. Her behavior is normal.    No results found for this or any previous visit (from the past 24 hour(s)). Dg  Tibia/fibula Left  09/10/2015  CLINICAL DATA:  Patient status post fall in the dry age. Lower extremity pain. EXAM: LEFT TIBIA AND FIBULA - 2 VIEW COMPARISON:  Left knee radiograph 02/13/2011 FINDINGS: Patient status post left knee arthroplasty. Hardware appears intact. Normal anatomic alignment. No evidence for acute fracture or dislocation. IMPRESSION: No acute osseous abnormality. Electronically Signed   By: Annia Belt M.D.   On: 09/10/2015 15:45   Dg Tibia/fibula Right  09/10/2015  CLINICAL DATA:  Fall with bilateral lower extremity pain and lacerations EXAM: RIGHT TIBIA AND FIBULA - 2 VIEW COMPARISON:  09/19/2010 right knee radiograph FINDINGS: Status post right total knee arthroplasty, with well-positioned right distal femoral and right proximal tibial prostheses, with no evidence of hardware fracture or loosening. Small soft tissue laceration anterior to the right proximal tibial shaft. No radiopaque foreign body. No osseous fracture or focal osseous lesion. IMPRESSION: 1. Small soft tissue laceration anterior to the proximal right tibial shaft. No osseous fracture. 2. Right total knee arthroplasty, with no hardware complication. Electronically Signed   By: Delbert Phenix M.D.   On: 09/10/2015 15:47   Ct Head Wo Contrast  09/11/2015  CLINICAL DATA:  Follow-up exam for traumatic brain injury. EXAM: CT HEAD WITHOUT CONTRAST TECHNIQUE: Contiguous axial images were obtained from the base of the skull through the vertex without intravenous contrast. COMPARISON:  Prior CT from 09/10/2015. FINDINGS: Parenchymal contusion centered at the left temporal lobe is increased in size now measuring 2.7 x 4.8 cm (Estimated volume 45 mL). Increased surrounding vasogenic edema without significant mass effect. Possible trace adjacent subarachnoid hemorrhage. No other definite acute intracranial hemorrhage. Previously questioned blood overlying the right parietal convexity on previous exam felt to be artifactual in nature.  No acute large vessel territory infarct. No midline shift. No hydrocephalus or evidence of ventricular trapping. No definite extra-axial fluid collection. Evolving right frontal scalp contusion. Skin staples at the posterior right occipital scalp. No acute abnormality about the orbits. Fluid levels within the bilateral maxillary sinuses again noted, similar to prior. Scattered mucosal thickening within the ethmoidal air cells also stable. Bilateral mastoid effusions also unchanged. Calvarium intact. IMPRESSION: 1. Increased size of left temporal lobe intraparenchymal hematoma now measuring 2.7 x 4.8 cm (estimated volume 45 mL). Increased associated vasogenic edema without significant mass effect. Probable trace adjacent subarachnoid hemorrhage. 2. No other new acute intracranial process. Previously questioned blood overlying the right parietal convexity no longer seen. This was likely artifactual on the prior exam. 3. Evolving right frontal scalp contusion. 4. Stable acute maxillary sinus disease and bilateral mastoid effusions. Electronically Signed   By: Rise Mu M.D.   On: 09/11/2015 05:37   Ct Head Wo Contrast  09/10/2015  CLINICAL DATA:  Patient with traumatic head injury. Positive reported loss of consciousness. Patient tripped hit her head on the car. Laceration to the back of the head. EXAM: CT HEAD WITHOUT CONTRAST CT MAXILLOFACIAL WITHOUT CONTRAST CT CERVICAL SPINE WITHOUT CONTRAST TECHNIQUE: Multidetector CT  imaging of the head, cervical spine, and maxillofacial structures were performed using the standard protocol without intravenous contrast. Multiplanar CT image reconstructions of the cervical spine and maxillofacial structures were also generated. COMPARISON:  None. FINDINGS: CT HEAD FINDINGS There is a 3.3 x 2.3 cm acute hematoma within the left temporal lobe. Small focal high attenuation peripherally overlying the right posterior parietal lobe (image 19; series 2). No significant  midline shift. Orbits are unremarkable. There is fluid within the mastoid air cells bilaterally, left-greater-than-right. Fluid within the maxillary sinuses bilaterally. Mucosal thickening involving the ethmoid air cells. Soft tissue swelling and hematoma formation overlying the right frontal calvarium and along the dorsal right calvarium. No underlying skull fracture. CT MAXILLOFACIAL FINDINGS Maxilla and mandible are intact. Nasal bone is intact. Zygomatic arches and pterygoid plates are intact. Fluid within the maxillary sinuses bilaterally. Opacification of the left-greater-than-right ethmoid air cells. No evidence for definite displaced skullbase fracture. Osseous demineralization of the skullbase. CT CERVICAL SPINE FINDINGS Reversal of the normal cervical lordosis. Grade 1 anterolisthesis of C3 on C4 likely secondary to degenerative facet disease at this level. C5-6 and C6-7 degenerative disc disease. Craniocervical junction is intact. Prevertebral soft tissues are unremarkable. Lung apices are clear. Visualized thyroid is unremarkable. IMPRESSION: There is a 3.3 cm acute intraparenchymal hemorrhage within the left temporal lobe, potentially posttraumatic in etiology. Small amount of high attenuation overlying the posterior right parietal lobe favored to represent a small amount of extra-axial blood products, potentially subarachnoid in etiology. Opacification of the left-greater-than-right mastoid air cells which is favored to be secondary to a chronic infectious or inflammatory process. No displaced skull fracture is visualized. Recommend clinical correlation to exclude the possibility of hemotympanum on the left. If there are blood products, this would be worrisome for nondisplaced skullbase fracture. Degenerative changes of the cervical spine without evidence for acute fracture. Critical Value/emergent results were called by telephone at the time of interpretation on 09/10/2015 at 2:46 pm to Dr. Ladona Ridgel, who  verbally acknowledged these results. Electronically Signed   By: Annia Belt M.D.   On: 09/10/2015 14:58   Ct Cervical Spine Wo Contrast  09/10/2015  CLINICAL DATA:  Patient with traumatic head injury. Positive reported loss of consciousness. Patient tripped hit her head on the car. Laceration to the back of the head. EXAM: CT HEAD WITHOUT CONTRAST CT MAXILLOFACIAL WITHOUT CONTRAST CT CERVICAL SPINE WITHOUT CONTRAST TECHNIQUE: Multidetector CT imaging of the head, cervical spine, and maxillofacial structures were performed using the standard protocol without intravenous contrast. Multiplanar CT image reconstructions of the cervical spine and maxillofacial structures were also generated. COMPARISON:  None. FINDINGS: CT HEAD FINDINGS There is a 3.3 x 2.3 cm acute hematoma within the left temporal lobe. Small focal high attenuation peripherally overlying the right posterior parietal lobe (image 19; series 2). No significant midline shift. Orbits are unremarkable. There is fluid within the mastoid air cells bilaterally, left-greater-than-right. Fluid within the maxillary sinuses bilaterally. Mucosal thickening involving the ethmoid air cells. Soft tissue swelling and hematoma formation overlying the right frontal calvarium and along the dorsal right calvarium. No underlying skull fracture. CT MAXILLOFACIAL FINDINGS Maxilla and mandible are intact. Nasal bone is intact. Zygomatic arches and pterygoid plates are intact. Fluid within the maxillary sinuses bilaterally. Opacification of the left-greater-than-right ethmoid air cells. No evidence for definite displaced skullbase fracture. Osseous demineralization of the skullbase. CT CERVICAL SPINE FINDINGS Reversal of the normal cervical lordosis. Grade 1 anterolisthesis of C3 on C4 likely secondary to degenerative facet disease at  this level. C5-6 and C6-7 degenerative disc disease. Craniocervical junction is intact. Prevertebral soft tissues are unremarkable. Lung apices  are clear. Visualized thyroid is unremarkable. IMPRESSION: There is a 3.3 cm acute intraparenchymal hemorrhage within the left temporal lobe, potentially posttraumatic in etiology. Small amount of high attenuation overlying the posterior right parietal lobe favored to represent a small amount of extra-axial blood products, potentially subarachnoid in etiology. Opacification of the left-greater-than-right mastoid air cells which is favored to be secondary to a chronic infectious or inflammatory process. No displaced skull fracture is visualized. Recommend clinical correlation to exclude the possibility of hemotympanum on the left. If there are blood products, this would be worrisome for nondisplaced skullbase fracture. Degenerative changes of the cervical spine without evidence for acute fracture. Critical Value/emergent results were called by telephone at the time of interpretation on 09/10/2015 at 2:46 pm to Dr. Ladona Ridgel, who verbally acknowledged these results. Electronically Signed   By: Annia Belt M.D.   On: 09/10/2015 14:58   Ct Maxillofacial Wo Cm  09/10/2015  CLINICAL DATA:  Patient with traumatic head injury. Positive reported loss of consciousness. Patient tripped hit her head on the car. Laceration to the back of the head. EXAM: CT HEAD WITHOUT CONTRAST CT MAXILLOFACIAL WITHOUT CONTRAST CT CERVICAL SPINE WITHOUT CONTRAST TECHNIQUE: Multidetector CT imaging of the head, cervical spine, and maxillofacial structures were performed using the standard protocol without intravenous contrast. Multiplanar CT image reconstructions of the cervical spine and maxillofacial structures were also generated. COMPARISON:  None. FINDINGS: CT HEAD FINDINGS There is a 3.3 x 2.3 cm acute hematoma within the left temporal lobe. Small focal high attenuation peripherally overlying the right posterior parietal lobe (image 19; series 2). No significant midline shift. Orbits are unremarkable. There is fluid within the mastoid air  cells bilaterally, left-greater-than-right. Fluid within the maxillary sinuses bilaterally. Mucosal thickening involving the ethmoid air cells. Soft tissue swelling and hematoma formation overlying the right frontal calvarium and along the dorsal right calvarium. No underlying skull fracture. CT MAXILLOFACIAL FINDINGS Maxilla and mandible are intact. Nasal bone is intact. Zygomatic arches and pterygoid plates are intact. Fluid within the maxillary sinuses bilaterally. Opacification of the left-greater-than-right ethmoid air cells. No evidence for definite displaced skullbase fracture. Osseous demineralization of the skullbase. CT CERVICAL SPINE FINDINGS Reversal of the normal cervical lordosis. Grade 1 anterolisthesis of C3 on C4 likely secondary to degenerative facet disease at this level. C5-6 and C6-7 degenerative disc disease. Craniocervical junction is intact. Prevertebral soft tissues are unremarkable. Lung apices are clear. Visualized thyroid is unremarkable. IMPRESSION: There is a 3.3 cm acute intraparenchymal hemorrhage within the left temporal lobe, potentially posttraumatic in etiology. Small amount of high attenuation overlying the posterior right parietal lobe favored to represent a small amount of extra-axial blood products, potentially subarachnoid in etiology. Opacification of the left-greater-than-right mastoid air cells which is favored to be secondary to a chronic infectious or inflammatory process. No displaced skull fracture is visualized. Recommend clinical correlation to exclude the possibility of hemotympanum on the left. If there are blood products, this would be worrisome for nondisplaced skullbase fracture. Degenerative changes of the cervical spine without evidence for acute fracture. Critical Value/emergent results were called by telephone at the time of interpretation on 09/10/2015 at 2:46 pm to Dr. Ladona Ridgel, who verbally acknowledged these results. Electronically Signed   By: Annia Belt  M.D.   On: 09/10/2015 14:58    Assessment/Plan: Diagnosis: TBI  [place] score:  ~VI  Speech to evaluate for  Post traumatic amnesia and interval GOAT scores to assess progress.  NeuroPsych evaluation for behavorial assessment.  Provide environmental management by reducing the level of stimulation, tolerating restlessness when possible, protecting patient from harming self or others and reducing patient's cognitive confusion.  Address behavioral concerns include providing structured environments and daily routines.  Cognitive therapy to direct modular abilities in order to maintain goals including problem solving, self regulation/monitoring, self management, attention, and memory.  Fall precautions; pt at risk for second impact syndrome  Prevention of secondary injury: monitor for hypotension, hypoxia, seizures or signs of increased ICP  Prophylactic AED:   Avoid medications that could impair cognitive abilities, such as anticholinergics, antihistaminic, benzodiazapines, narcotics, etc when possible  1. Does the need for close, 24 hr/day medical supervision in concert with the patient's rehab needs make it unreasonable for this patient to be served in a less intensive setting? Potentially  Co-Morbidities requiring supervision/potential complications: DMT2 (Monitor in accordance with exercise and adjust meds as necessary), OA s/p B-TKR (cont to monitor pain), bradycardia (monitor HR with increased physical activity), leukocytosis (cont to monitor for signs and symptoms of infection, further workup if indicated), ?AKI (avoid nephrotoxic meds), obesity Body mass index is 34.18 kg/(m^2)., Diet and exercise education, encourage weight loss to increase endurance and promote overall health) 2. Due to safety, skin/wound care, disease management, medication administration and patient education, does the patient require 24 hr/day rehab nursing? Potentially 3. Does the patient require coordinated  care of a physician, rehab nurse, PT (1-2 hrs/day, 5 days/week), OT (1-2 hrs/day, 5 days/week) and SLP (1-2 hrs/day, 5 days/week) to address physical and functional deficits in the context of the above medical diagnosis(es)? Potentially Addressing deficits in the following areas: TBD 4. Can the patient actively participate in an intensive therapy program of at least 3 hrs of therapy per day at least 5 days per week? Potentially 5. The potential for patient to make measurable gains while on inpatient rehab is TBD 6. Anticipated functional outcomes upon discharge from inpatient rehab are TBD  with PT, TBD with OT, TBD with SLP. 7. Estimated rehab length of stay to reach the above functional goals is: TBD 8. Does the patient have adequate social supports and living environment to accommodate these discharge functional goals? Potentially 9. Anticipated D/C setting: Home 10. Anticipated post D/C treatments: HH therapy and Home excercise program 11. Overall Rehab/Functional Prognosis: good  RECOMMENDATIONS: This patient's condition is appropriate for continued rehabilitative care in the following setting: Will await formal therapy consults as well as results from head CT and potential future plans prior to determining most appropriate discharge disposition. If patient does not require a IRF services, would recommend follow up with physical medicine and rehabilitation as outpatient. Patient has agreed to participate in recommended program. Potentially Note that insurance prior authorization may be required for reimbursement for recommended care.  Comment: Rehab Admissions Coordinator to follow up.  Maryla Morrow, MD 09/12/2015

## 2015-09-12 NOTE — Progress Notes (Signed)
I await therapy evals to assist in determining most appropriate rehab venue options. [phone]

## 2015-09-12 NOTE — Progress Notes (Signed)
Overall stable. Denies headache. No new issues.  Afebrile. Vitals are stable. Wide awake. He appears in no distress. Speech with profound fluent aphasia, unchanged. Motor 5/5 bilateral upper and lower extremities. Cranial nerve function intact.  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 Plan follow-up head CT scan today area and speech therapy consult and evaluation. Rehabilitation service evaluation.

## 2015-09-12 NOTE — Evaluation (Signed)
Speech Language Pathology Evaluation Patient Details Name: Anita Lee MRN: [id number] DOB: 11/20/1950 Today's Date: 09/12/2015 Time: 4098-1191 SLP Time Calculation (min) (ACUTE ONLY): 29 min  Problem List:  Patient Active Problem List   Diagnosis Date Noted  . Intracerebral hematoma (HCC)   . Diabetes mellitus type 2 in obese (HCC)   . Status post left knee replacement   . Status post right knee replacement   . Bradycardia   . AKI (acute kidney injury) (HCC)   . Leukocytosis   . TBI (traumatic brain injury) (HCC) 09/10/2015   Past Medical History:  Past Medical History  Diagnosis Date  . Diabetes mellitus without complication [facility])    Past Surgical History:  Past Surgical History  Procedure Laterality Date  . Replacement total knee bilateral     HPI:  65 year old female fell while going upstairs striking her for head. Patient helped to her feet when she then fell backwards striking the back of her head. None of these falls cause a loss of consciousness however the patient was stunned and not acting appropriately. Patient taken to local emergency room. Head CT scan demonstrated a left posterior temporal hemorrhage. Patient transferred for additional care. No history of seizure. No history of numbness, paresthesias or weakness.   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 Pt perseverative throughout session on "computer" and related items (USB, wires). Pt unaware of language errors or peseveration and unable to comprehend or acknowledge errors after SLP provided her examples using visual cues. Decreased awareness, however cognitive abilities are mostly functional. She will require 24 hour supervision. Spouse not present, however RN states  he appears frustrated and needs education to understand wife's deficits. Pt may be discharged soon per RN and is recommended for outpatient ST (? [facility]- lives in [place]). SLP plans to educate husband re: impairments.        SLP Assessment  Patient needs continued Speech Lanaguage Pathology Services    Follow Up Recommendations  Outpatient SLP    Frequency and Duration min 2x/week  2 weeks      SLP Evaluation Prior Functioning  Cognitive/Linguistic Baseline: Within functional limits  Lives With: Spouse Available Help at Discharge: Family Vocation: Retired   IT consultant  Overall Cognitive Status: Impaired/Different from baseline (mild deficits) Arousal/Alertness: Awake/alert Orientation Level: Oriented to person;Oriented to place;Oriented to situation Attention: Sustained Sustained Attention: Appears intact Memory:  (will continue to assess, appears functional for basic info) Awareness: Impaired Awareness Impairment: Emergent impairment Problem Solving: Appears intact Executive Function: Self Correcting;Self Monitoring Self Monitoring: Impaired Self Correcting: Impaired Safety/Judgment: Impaired    Comprehension  Auditory Comprehension Overall Auditory Comprehension: Impaired Yes/No Questions: Impaired Basic Biographical Questions: 51-75% accurate Commands: Impaired Multistep Basic Commands: 25-49% accurate Visual Recognition/Discrimination Discrimination: Not tested Reading Comprehension Reading Status:  (will assess)    Expression Expression Primary Mode of Expression: Verbal Verbal Expression Overall Verbal Expression: Impaired Initiation: No impairment Level of Generative/Spontaneous Verbalization: Conversation Repetition:  (5/6 correct. difficulty with sentence) Naming: Impairment Confrontation: Impaired Convergent: 25-49% accurate (25%) Verbal Errors: Perseveration;Neologisms;Phonemic paraphasias;Not aware of errors Pragmatics: No impairment Written  Expression Dominant Hand: Right Written Expression: Exceptions to [facility] Self Formulation Ability: Sentence   Oral / Motor  Oral Motor/Sensory Function Overall Oral Motor/Sensory Function: Within functional limits Motor Speech Overall Motor Speech: Appears within functional limits for tasks assessed Respiration: Within  functional limits Phonation: Normal Resonance: Within functional limits Articulation: Within functional limitis Intelligibility: Intelligible Motor Planning: Witnin functional limits   GO                    Royce MacadamiaLitaker, Suzanne Kho Willis 09/12/2015, 3:23 PM   Breck CoonsLisa Willis Lonell FaceLitaker M.Ed [employer]-SLP Pager [phone]

## 2015-09-13 MED ORDER — METFORMIN HCL 500 MG PO TABS
500.0000 mg | ORAL_TABLET | Freq: Two times a day (BID) | ORAL | Status: DC
  Administered 2015-09-13 – 2015-09-14 (×2): 500 mg via ORAL
  Filled 2015-09-13 (×2): qty 1

## 2015-09-13 MED ORDER — PRAVASTATIN SODIUM 40 MG PO TABS
40.0000 mg | ORAL_TABLET | Freq: Every day | ORAL | Status: DC
  Administered 2015-09-13: 40 mg via ORAL
  Filled 2015-09-13 (×2): qty 1

## 2015-09-13 MED ORDER — TRIAMTERENE-HCTZ 37.5-25 MG PO TABS
1.0000 | ORAL_TABLET | Freq: Every day | ORAL | Status: DC
  Administered 2015-09-13 – 2015-09-14 (×2): 1 via ORAL
  Filled 2015-09-13: qty 1

## 2015-09-13 MED ORDER — ESCITALOPRAM OXALATE 10 MG PO TABS
10.0000 mg | ORAL_TABLET | Freq: Every day | ORAL | Status: DC
  Administered 2015-09-13 – 2015-09-14 (×2): 10 mg via ORAL
  Filled 2015-09-13: qty 1

## 2015-09-13 NOTE — Progress Notes (Signed)
Overall stable. No headache. Patient still with significant fluent aphasia.  Afebrile. Awake and alert. Remains confused secondary to his aphasia but otherwise appears appropriate. Motor 5/5 bilaterally. Sensory exam intact.  Follow-up head CT scan demonstrates stable appearance of her left temporal lobe intracerebral hematoma. No significant mass effect.  Status post traumatic brain injury with left temporal intracerebral hematoma and resultant fluent aphasia. Patient is stable medically. We'll transfer to floor. Await disposition options regarding possible inpatient rehabilitation versus home discharge with therapies.

## 2015-09-13 NOTE — Evaluation (Signed)
Occupational Therapy Evaluation Patient Details Name: Anita Lee MRN: [id number] DOB: 06-May-1951 Today's Date: 09/13/2015    History of Present Illness Anita Lee is a 65 y.o. female with history of DMT2, OA s/p B-TKR who tripped while going down stairs on 09/10/15. She struck her head on the car then had brief LOC with fall backwards where she struck the back of her head. She sustained lacerations to back of head and abrasions to bilateral shins and was reported to not be acting appropriately. She was evaluated in ED and posterior scalp laceration sutured. X rays bilateral tib/fib negative for fracture. CT head demonstrated 3.3 cm acute IPH in let temporal lobe. She was evaluated by Dr. Jordan Likes who recommended serial CCT for follow up   Clinical Impression   Pt admitted with above. She demonstrates the below listed deficits and will benefit from continued OT to maximize safety and independence with BADLs.  Pt presents to OT with communication and cognitive deficits including poor awareness and poor safety.  She requires supervision for functional mobility and ADLs.  She will require 24 hour close supervision at discharge (she indicates her spouse is retired and able to provide supervision), and OPOT.  Will follow acutely.       Follow Up Recommendations  Outpatient OT;Supervision/Assistance - 24 hour    Equipment Recommendations  None recommended by OT    Recommendations for Other Services       Precautions / Restrictions Precautions Precautions: Fall      Mobility Bed Mobility Overal bed mobility: Needs Assistance Bed Mobility: Supine to Sit     Supine to sit: Supervision     General bed mobility comments: requires supervision for safety due to lines and poor safety awareness   Transfers Overall transfer level: Needs assistance Equipment used: None Transfers: Sit to/from Stand;Stand Pivot Transfers Sit to Stand: Supervision Stand pivot transfers:  Supervision            Balance Overall balance assessment: No apparent balance deficits (not formally assessed)                                          ADL Overall ADL's : Needs assistance/impaired Eating/Feeding: Independent   Grooming: Wash/dry hands;Wash/dry face;Oral care;Supervision/safety;Standing   Upper Body Bathing: Set up;Supervision/ safety;Sitting   Lower Body Bathing: Supervison/ safety;Sit to/from stand   Upper Body Dressing : Set up;Supervision/safety;Sitting   Lower Body Dressing: Supervision/safety;Sit to/from stand   Toilet Transfer: Supervision/safety;Ambulation;Comfort height toilet;Grab bars   Toileting- Clothing Manipulation and Hygiene: Supervision/safety;Sit to/from stand       Functional mobility during ADLs: Supervision/safety       Vision Vision Assessment?: Yes Eye Alignment: Within Functional Limits Alignment/Gaze Preference: Within Defined Limits Tracking/Visual Pursuits: Able to track stimulus in all quads without difficulty Visual Fields: No apparent deficits   Development worker, international 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   Praxis Praxis Praxis tested?: Within functional limits    Pertinent Vitals/Pain Pain Assessment: No/denies pain     Hand Dominance Right   Extremity/Trunk Assessment Upper Extremity Assessment Upper Extremity Assessment: RUE deficits/detail RUE Deficits / Details: Rt shoulder 4+/5   Lower Extremity Assessment Lower Extremity Assessment: Defer to PT evaluation   Cervical / Trunk Assessment Cervical / Trunk Assessment: Normal   Communication Communication Communication: Expressive difficulties   Cognition Arousal/Alertness: Awake/alert Behavior During Therapy: WFL for tasks assessed/performed Overall Cognitive Status: Difficult to assess  Area of Impairment: Attention;Safety/judgement;Awareness;Problem solving   Current Attention Level: Selective     Safety/Judgement: Decreased awareness of  safety;Decreased awareness of deficits Awareness: Intellectual Problem Solving: Requires verbal cues General Comments: able to locate her room, performs famililar tasks without difficulty, but demonstrates poor awareness of deficits and safety.   mod cues for problem solving  with novel tasks (wrapped herself in heart monitor line without awareness    General Comments       Exercises       Shoulder Instructions      Home Living Family/patient expects to be discharged to:: Private residence Living Arrangements: Spouse/significant other Available Help at Discharge: Family;Available 24 hours/day Type of Home: House Home Access: Stairs to enter Enter[employer] CorporationEntrance Stairs-Number of Steps: 3 or 4 Entrance Stairs-Rails: None Home Layout: Two level;Able to live on main level with bedroom/bathroom Alternate Level Stairs-Number of Steps: full flight        Bathroom Toilet: Standard         Additional Comments: All information provided by pt via yes/no questions.   She reports spouse is retired       Prior Functioning/Environment Level of Independence: Independent             OT Diagnosis: Cognitive deficits;Other (comment) (communication deficits )   OT Problem List: Decreased cognition;Decreased safety awareness   OT Treatment/Interventions: Self-care/ADL training;Therapeutic activities;Cognitive remediation/compensation;Patient/family education    OT Goals(Current goals can be found in the care plan section) Acute Rehab OT Goals Patient Stated Goal: did not state  OT Goal Formulation: With patient Time For Goal Achievement: 09/20/15 Potential to Achieve Goals: Good ADL Goals Additional ADL Goal #1: Pt will perform ADLs independently  Additional ADL Goal #2: Pt will participate in further functional cognitive assessment   OT Frequency: Min 2X/week   Barriers to D/C:            Co-evaluation PT/OT/SLP Co-Evaluation/Treatment: Yes Reason for Co-Treatment: Necessary to  address cognition/behavior during functional activity   OT goals addressed during session: ADL's and self-care      End of Session Equipment Utilized During Treatment: Gait belt Nurse Communication: Mobility status  Activity Tolerance: Patient tolerated treatment well Patient left: in chair;with call bell/phone within reach   Time: 1356-1419 OT Time Calculation (min): 23 min Charges:  OT Evaluation $OT Eval Moderate Complexity: 1 Procedure G-Codes:    Anita Lee, Anita Lee 09/13/2015, 2:49 PM

## 2015-09-13 NOTE — Progress Notes (Signed)
Therapy is recommending outpatient therapy at this time. I contacted pt's spouse by phone and updated him to therapy recommendations. [place] in [place] and his preference would be outpt there if it can be arranged. I will alert RN CM.  We will sign off. [phone]

## 2015-09-13 NOTE — Progress Notes (Signed)
Speech Language Pathology Treatment: Cognitive-Linquistic  Patient Details Name: Rolene ArbourDebra Corenne Bethard MRN: [id number] DOB: 05/31/1950 Today's Date: 09/13/2015 Time: 4098-11910836-0904 SLP Time Calculation (min) (ACUTE ONLY): 28 min  Assessment / Plan / Recommendation Clinical Impression  Pt presented with improved auditory comprehension from previously and presents more like Broca's aphasia today. Able to follow simple 2-step commands independently, however 2-step commands with manipulatives required mod verbal and visual cues to follow. Pt became frustrated during conversation due to word finding deficits, indicative of improved awareness of errors and auditory comprehension. Mod verbal and written cues facilitated word finding at the conversation level. Pt and husband educated re: cueing hierarchy to facilitate word finding and continued SLP intervention [facility]. SLP will f/u to provide aphasia tx and further caregiver education.   HPI HPI: 65 year old female fell while going upstairs striking her for head. Patient helped to her feet when she then fell backwards striking the back of her head. None of these falls cause a loss of consciousness however the patient was stunned and not acting appropriately. Patient taken to local emergency room. Head CT scan demonstrated a left posterior temporal hemorrhage. Patient transferred for additional care. No history of seizure. No history of numbness, paresthesias or weakness.      SLP Plan  Continue with current plan of care     Recommendations                Oral Care Recommendations: Oral care BID Follow up Recommendations: Outpatient SLP Plan: Continue with current plan of care     Kashay Cavenaugh, Student-SLP            Jermanie Minshall 09/13/2015, 12:15 PM

## 2015-09-13 NOTE — Care Management Note (Signed)
Case Management Note  Patient Details  Name: Anita Lee MRN: [id number] Date of Birth: 04/25/1951  Subjective/Objective:    Pt admitted with TBI. She is from home with her husband.                 Action/Plan: PT/OT recs are for outpatient therapy. CM following for discharge needs.   Expected Discharge Date:                  Expected Discharge Plan:     In-House Referral:     Discharge planning Services     Post Acute Care Choice:    Choice offered to:     DME Arranged:    DME Agency:     HH Arranged:    HH Agency:     Status of Service:  In process, will continue to follow  Medicare Important Message Given:    Date Medicare IM Given:    Medicare IM give by:    Date Additional Medicare IM Given:    Additional Medicare Important Message give by:     If discussed at Long Length of Stay Meetings, dates discussed:    Additional Comments:  Kermit BaloKelli F Ajit Errico, RN 09/13/2015, 3:53 PM

## 2015-09-13 NOTE — Evaluation (Signed)
Physical Therapy Evaluation/Co-session with OT Patient Details Name: Anita Lee MRN: [id number] DOB: 09/28/1950 Today's Date: 09/13/2015   History of Present Illness  Anita Lee is a 65 y.o. female with history of DMT2, OA s/p B-TKR who tripped while going down stairs on 09/10/15. She struck her head on the car then had brief LOC with fall backwards where she struck the back of her head. She sustained lacerations to back of head and abrasions to bilateral shins and was reported to not be acting appropriately. She was evaluated in ED and posterior scalp laceration sutured. X rays bilateral tib/fib negative for fracture. CT head demonstrated 3.3 cm acute IPH in let temporal lobe. She was evaluated by Dr. Jordan LikesPool who recommended serial CT for follow up  Clinical Impression  Pt is very mobile despite fall with head injury.  Her fall risk comes for her safety awareness and awareness of deficits.  She will likely be able to d/c home as long as husband can provide 24/7 assist at discharge.   PT to follow acutely for deficits listed below.       Follow Up Recommendations Outpatient PT;Supervision/Assistance - 24 hour    Equipment Recommendations  None recommended by PT    Recommendations for Other Services   NA    Precautions / Restrictions Precautions Precautions: Fall Precaution Comments: due to safety/cognitive  issues more than balance issues      Mobility  Bed Mobility Overal bed mobility: Needs Assistance Bed Mobility: Supine to Sit     Supine to sit: Supervision     General bed mobility comments: Supervision for safety due to lines and poor safety awareness related to being tangled in lines.   Transfers Overall transfer level: Needs assistance Equipment used: None Transfers: Sit to/from [employer]Stand;Stand Pivot Transfers Sit to Stand: Supervision Stand pivot transfers: Supervision       General transfer comment: Supervision for safety.  Transferred bed to  recliner chair and pt turned the wrong direction and got tangeled tightly in her monitor lines, sat down completely unaware that this was a safety issue.   Ambulation/Gait Ambulation/Gait assistance: Supervision Ambulation Distance (Feet): 180 Feet Assistive device: None Gait Pattern/deviations: Wide base of support     General Gait Details: Pt with wide almost stiff legged gait pattern.  As described in chart, this may be her basline gait pattern. No balance checks during gait and pt able to turn 180, get something off of the floor, and walk backwards with supervision for safety.          Balance Overall balance assessment: No apparent balance deficits (not formally assessed)                                           Pertinent Vitals/Pain Pain Assessment: No/denies pain    Home Living Family/patient expects to be discharged to:: Private residence Living Arrangements: Spouse/significant other Available Help at Discharge: Family;Available 24 hours/day Type of Home: House Home Access: Stairs to enter Entrance Stairs-Rails: None Entrance Stairs-Number of Steps: 3 or 4 Home Layout: Two level;Able to live on main level with bedroom/bathroom   Additional Comments: All information provided by pt via yes/no questions.   She reports spouse is retired     Prior Function Level of Independence: Independent         Comments: despite bil TKA, pt did not use a cane or  RW PTA, per chart she did have a wide abnormal gait pattern.     Hand Dominance   Dominant Hand: Right    Extremity/Trunk Assessment   Upper Extremity Assessment: Defer to OT evaluation RUE Deficits / Details: Rt shoulder 4+/5         Lower Extremity Assessment: RLE deficits/detail;LLE deficits/detail RLE Deficits / Details: all 5/5 except bil knees 4/5 LLE Deficits / Details: all 5/5 except bil knees 4/5  Cervical / Trunk Assessment: Normal  Communication   Communication: Expressive  difficulties  Cognition Arousal/Alertness: Awake/alert Behavior During Therapy: WFL for tasks assessed/performed Overall Cognitive Status: Difficult to assess Area of Impairment: Attention;Safety/judgement;Awareness;Problem solving;[place] level   Current Attention Level: Selective     Safety/Judgement: Decreased awareness of safety;Decreased awareness of deficits Awareness: Intellectual Problem Solving: Requires verbal cues General Comments: able to locate her room, performs famililar tasks without difficulty, but demonstrates poor awareness of deficits and safety.   mod cues for problem solving  with novel tasks (wrapped herself in heart monitor line without awareness, unaware of speech deficits             Assessment/Plan    PT Assessment Patient needs continued PT services  PT Diagnosis Difficulty walking;Abnormality of gait;Generalized weakness;Acute pain;Altered mental status   PT Problem List Decreased cognition;Decreased safety awareness;Decreased balance;Decreased mobility  PT Treatment Interventions DME instruction;Gait training;Stair training;Functional mobility training;Therapeutic activities;Therapeutic exercise;Balance training;Neuromuscular re-education;Cognitive remediation;Patient/family education   PT Goals (Current goals can be found in the Care Plan section) Acute Rehab PT Goals Patient Stated Goal: did not state  PT Goal Formulation: Patient unable to participate in goal setting Time For Goal Achievement: 09/27/15 Potential to Achieve Goals: Good    Frequency Min 4X/week        Co-evaluation PT/OT/SLP Co-Evaluation/Treatment: Yes Reason for Co-Treatment: Necessary to address cognition/behavior during functional activity PT goals addressed during session: Mobility/safety with mobility;Balance;Strengthening/ROM OT goals addressed during session: ADL's and self-care       End of Session Equipment Utilized During Treatment: Gait belt Activity Tolerance:  Patient tolerated treatment well Patient left: in chair;with call bell/phone within reach Nurse Communication: Mobility status        Time: 1610-9604 PT Time Calculation (min) (ACUTE ONLY): 21 min   Charges:   PT Evaluation $PT Eval Moderate Complexity: 1 Procedure          Wynne Rozak B. Nyaira Hodgens, PT, DPT [phone]   09/13/2015, 3:16 PM

## 2015-09-14 NOTE — Progress Notes (Signed)
D/C orders received, pt for D/C home today.  IV and telemetry D/C.  Rx and D/C instructions given with verbalized understanding.  Family at bedside to assist with D/C.  Staff brought pt downstairs via wheelchair.  

## 2015-09-14 NOTE — Care Management Note (Addendum)
Case Management Note  Patient Details  Name: Anita Lee MRN: [id number] Date of Birth: 10/11/1950  Subjective/Objective:                    Action/Plan: Patient discharging home with [facility] services. CM met with the patient and her family and provided them a list of [facility] agencies in the [place] area. They selected [facility]. Butch Penny with [facility] notified and accepted the referral. Pt has a PCP at the [facility] a Dr Davis Gourd. Information given to Butch Penny.  Will update the bedside RN.   Expected Discharge Date:                  Expected Discharge Plan:  [place]  In-House Referral:     Discharge planning Services  CM Consult  Post Acute Care Choice:  Home Health Choice offered to:  Patient, Spouse  DME Arranged:    DME Agency:     HH Arranged:  PT, Speech Therapy HH Agency:  [facility]  Status of Service:  Completed, signed off  Medicare Important Message Given:    Date Medicare IM Given:    Medicare IM give by:    Date Additional Medicare IM Given:    Additional Medicare Important Message give by:     If discussed at [place] of Stay Meetings, dates discussed:    Additional Comments:  Pollie Friar, RN 09/14/2015, 11:50 AM

## 2015-09-14 NOTE — Progress Notes (Signed)
Occupational Therapy Treatment Patient Details Name: Anita ArbourDebra Corenne Cassady MRN: [id number] DOB: 09/13/1950 Today's Date: 09/14/2015    History of present illness Anita Lee is a 65 y.o. female with history of DMT2, OA s/p B-TKR who tripped while going down stairs on 09/10/15. She struck her head on the car then had brief LOC with fall backwards where she struck the back of her head. She sustained lacerations to back of head and abrasions to bilateral shins and was reported to not be acting appropriately. She was evaluated in ED and posterior scalp laceration sutured. X rays bilateral tib/fib negative for fracture. CT head demonstrated 3.3 cm acute IPH in let temporal lobe. She was evaluated by Dr. Jordan LikesPool who recommended serial CT for follow up   OT comments  Pt with improved speech, but still with errors.  She demonstrates impaired functional memory and impaired functional problem solving.   She requires supervision for ADLs.  Spouse is very aware of deficits and is able to provide 24 hour supervision.  Recommend OPOT  Follow Up Recommendations  Outpatient OT;Supervision/Assistance - 24 hour    Equipment Recommendations  None recommended by OT    Recommendations for Other Services      Precautions / Restrictions Precautions Precautions: Fall       Mobility Bed Mobility Overal bed mobility: Independent                Transfers Overall transfer level: Modified independent                    Balance Overall balance assessment: No apparent balance deficits (not formally assessed)                                 ADL                                         General ADL Comments: Pt requires supervision for ADLs.  Discussed safety concerns with pt and spouse.  He reports that he is planning to provide close supervision at discharge and he will be with her 24/7      Vision                 Additional Comments: vision  appears intact    Perception     Praxis      Cognition   Behavior During Therapy: WFL for tasks assessed/performed Overall Cognitive Status: Impaired/Different from baseline Area of Impairment: Problem solving;Memory     Memory: Decreased short-term memory    Safety/Judgement: Decreased awareness of deficits;Decreased awareness of safety   Problem Solving: Difficulty sequencing;Requires verbal cues;Requires tactile cues General Comments: Pt with significantly improved speech today, but still makes errors with no errors... worse when she fatigues.   She was unable to perform path finding from one unit to the other, and was unable to visually recall room number     Extremity/Trunk Assessment               Exercises     Shoulder Instructions       General Comments      Pertinent Vitals/ Pain       Pain Assessment: No/denies pain  Home Living  Prior Functioning/Environment              Frequency Min 2X/week     Progress Toward Goals  OT Goals(current goals can now be found in the care plan section)  Progress towards OT goals: Progressing toward goals  ADL Goals Additional ADL Goal #1: Pt will perform ADLs independently  Additional ADL Goal #2: Pt will participate in further functional cognitive assessment   Plan Discharge plan remains appropriate    Co-evaluation                 End of Session     Activity Tolerance Patient tolerated treatment well   Patient Left in chair;with call bell/phone within reach;with family/visitor present   Nurse Communication Mobility status        Time: 1610-9604 OT Time Calculation (min): 34 min  Charges: OT Treatments $Therapeutic Activity: 23-37 mins  Josemiguel Gries M 09/14/2015, 12:31 PM

## 2015-09-14 NOTE — Progress Notes (Signed)
Overall stable. Patient denies headache.No problems overnight area  Afebrile. Vitals are stable.  Awake and alert. Oriented times person and place which is improved.Motor 5/5 bilaterally.  Status post traumatic brain injury with left temporal hematoma.Overall patient making good steady improvement. Plan to discussed situation with her husband today regarding possible home discharge today.

## 2015-09-19 NOTE — Discharge Summary (Signed)
Physician Discharge Summary  Patient ID: Anita Lee MRN: [id number] DOB/AGE: 65/12/1950 65 y.o.  Admit date: 09/10/2015 Discharge date: 09/19/2015  Admission Diagnoses:  Discharge Diagnoses:  Active Problems:   TBI (traumatic brain injury) (HCC)   Intracerebral hematoma (HCC)   Diabetes mellitus type 2 in obese [facility](HCC)   Status post left knee replacement   Status post right knee replacement   Bradycardia   AKI (acute kidney injury) (HCC)   Leukocytosis   Discharged Condition: fair  Hospital Course: Patient admitted after falls with subsequent traumatic brain injury. Workup demonstrates evidence of a significant left temporal lobe intracerebral hemorrhage. Patient observed with stable changes of fluent aphasia. No worsening over time. Plan for discharge home with therapy.  Consults:   Significant Diagnostic Studies:   Treatments:   Discharge Exam: Blood pressure 130/77, pulse 85, temperature 98.6 F (37 C), temperature source Oral, resp. rate 18, height 5\' 2"  (1.575 m), weight 84.8 kg (186 lb 15.2 oz), SpO2 93 %. Patient is awake and alert. She is oriented to person and place. Her speech is fluent but her content is abnormal secondary to word finding difficulties. Motor and sensory function of the extremities are normal. Cranial nerve function is normal. She has lacerations in both anterior legs which are healing well. Chest and abdomen are benign.  Disposition: 01-Home or Self Care  Discharge Instructions    Face-to-face encounter (required for Medicare/Medicaid patients)    Complete by:  As directed   I [name] A certify that this patient is under my care and that I, or a nurse practitioner or physician's assistant working with me, had a face-to-face encounter that meets the physician face-to-face encounter requirements with this patient on 09/14/2015. The encounter with the patient was in whole, or in part for the following medical condition(s) which is the primary  reason for home health care (List medical condition): Intra cerebral hemorrhage secondary to TBI  The encounter with the patient was in whole, or in part, for the following medical condition, which is the primary reason for home health care:  TBI  I certify that, based on my findings, the following services are medically necessary home health services:   Physical therapy Speech language pathology    Reason for Medically Necessary Home Health Services:  Therapy- Home Adaptation to Facilitate Safety  My clinical findings support the need for the above services:  Unable to leave home safely without assistance and/or assistive device  Further, I certify that my clinical findings support that this patient is homebound due to:  Unable to leave home safely without assistance     Home Health    Complete by:  As directed   To provide the following care/treatments:   SLP PT              Medication List    TAKE these medications        escitalopram 10 MG tablet  Commonly known as:  LEXAPRO  Take 10 mg by mouth daily.     metFORMIN 500 MG tablet  Commonly known as:  GLUCOPHAGE  Take 500 mg by mouth 2 (two) times daily with a meal.     pravastatin 40 MG tablet  Commonly known as:  PRAVACHOL  Take 40 mg by mouth daily.     triamterene-hydrochlorothiazide 37.5-25 MG capsule  Commonly known as:  DYAZIDE  Take 1 capsule by mouth daily.         Signed: Amberlin Utke A 09/19/2015, 7:52 AM

## 2015-11-06 ENCOUNTER — Encounter: Payer: Self-pay | Admitting: Speech Pathology

## 2015-11-06 ENCOUNTER — Ambulatory Visit: Payer: Medicare HMO | Attending: Family Medicine | Admitting: Speech Pathology

## 2015-11-06 DIAGNOSIS — R4701 Aphasia: Secondary | ICD-10-CM | POA: Insufficient documentation

## 2015-11-06 NOTE — Therapy (Signed)
[place] [facility] MAIN [facility] SERVICES [address] [place], [place], [address] Phone: [phone]   Fax:  [phone]  Speech Language Pathology Evaluation  Patient Details  Name: Anita Lee MRN: [id number] Date of Birth: 1950-07-21 Referring Provider: Maudie Flakes, MD  Encounter Date: 11/06/2015      End of Session - 11/06/15 1458    Visit Number 1   Number of Visits 9   Date for SLP Re-Evaluation 12/06/15   SLP Start Time 0900   SLP Stop Time  1000   SLP Time Calculation (min) 60 min   Activity Tolerance Patient tolerated treatment well      Past Medical History  Diagnosis Date  . Diabetes mellitus without complication [facility])     Past Surgical History  Procedure Laterality Date  . Replacement total knee bilateral      There were no vitals filed for this visit.      Subjective Assessment - 11/06/15 1456    Subjective Anita Lee states that she has made a "95-98%" recovery since her TBI. She reports occasional word finding difficulties, but that all other symptoms have resolved and her language is sufficient for her ADLs.   Currently in Pain? No/denies            SLP Evaluation [facility] - 11/06/15 0001    SLP Visit Information   SLP Received On 11/06/15   Referring Provider Maudie Flakes, MD   Onset Date 09/10/15   Medical Diagnosis TBI   Subjective   Subjective Anita Lee has been referred to outpatient SLP services following a TBI on 09/10/15. She presented with a significant left posterior temporal intracerebral hemorrhage. 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 Following discharge Anita Lee completed 3 weeks of in-home SLP treatment and was then referred to outpatient services.   Prior Functional Status   Cognitive/Linguistic Baseline Within  functional limits   Oral Motor/Sensory Function   Overall Oral Motor/Sensory Function Appears within functional limits for tasks assessed   Motor Speech   Overall Motor Speech Appears within functional limits for tasks assessed   Standardized Assessments   Standardized Assessments  Western Aphasia Battery revised         Western Aphasia Battery- Revised  Spontaneous Speech      Information content  10/10       Fluency   10/10     Comprehension     Yes/No questions  60/60        Auditory Word Recognition 60/60        Sequential Commands 80/80    Repetition   97/100     Naming    Object Naming  60/60        Word Fluency     9/20        Sentence Completion   10/10        Responsive Speech   10/10     Aphasia Quotient  97.2/100   Reading and Writing    Reading   100/100 with minimal cues; 76/100 independently     Writing   98/100   Language Quotient  99/100; 87/100                   SLP Education - 11/06/15 1457    Education provided Yes   Education Details Tentative SLP goals   Person(s) Educated Patient   Methods  Explanation   Comprehension Verbalized understanding            SLP Long Term Goals - 11/06/15 1501    SLP LONG TERM GOAL #1   Title Client will participate in developing a home exercise program for communication   Time 4   Period Weeks   Status New          Plan - 11/06/15 1459    Clinical Impression Statement Anita Lee's language is functional and does not limit her ability to communicate effectively and efficiently. She did exhibit several semantic and phonemic paraphasias (i.e. balloon/kite, braked bed/baked bread) and perseverations (repetition of "Phillips head" screwdriver instead of flat head despite clinician correction) which went unnoticed by the patient until brought to her awareness. 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 The client will benefit from skilled speech therapy to provide a home exercise program to provide guided practice addressing word finding difficulties.   Speech Therapy Frequency 2x / week   Duration 4 weeks   Treatment/Interventions Multimodal communcation approach;Patient/family education   Potential to Achieve Goals Good   Potential Considerations Ability to learn/carryover information;Co-morbidities;Cooperation/participation level;Medical prognosis;Previous level of function;Pain level;Severity of impairments;Financial resources;Family/community support   SLP Home Exercise Plan TBD   Consulted and Agree with Plan of Care Patient      Patient will benefit from skilled therapeutic intervention in order to improve the following deficits and impairments:   Aphasia    Problem List Patient Active Problem List   Diagnosis Date Noted  . Intracerebral hematoma (HCC)   . Diabetes mellitus type 2 in obese (HCC)   . Status post left knee replacement   . Status post right knee replacement   . Bradycardia   . AKI (acute kidney injury) (HCC)   . Leukocytosis   . TBI (traumatic brain injury) (HCC) 09/10/2015    Anita Lee 11/06/2015, 3:02 PM  [place] [facility] [address] [place], [place], [address] Phone: [phone]   Fax:  [phone]  Name: Anita Lee MRN: [id number] Date of Birth: 01/06/1951

## 2015-11-09 ENCOUNTER — Ambulatory Visit: Payer: Medicare HMO | Admitting: Speech Pathology

## 2015-11-09 ENCOUNTER — Encounter: Payer: Self-pay | Admitting: Speech Pathology

## 2015-11-09 DIAGNOSIS — R4701 Aphasia: Secondary | ICD-10-CM

## 2015-11-09 NOTE — Therapy (Signed)
[place] MAIN [facility] SERVICES [address] [place], [place], [address] Phone: [phone]   Fax:  [phone]  Speech Language Pathology Treatment/ Discharge Note  Patient Details  Name: Anita Lee MRN: [id number] Date of Birth: [birth year] Referring Provider: Thereasa Distance, MD  Encounter Date: 11/09/2015      End of Session - 11/09/15 1715    Visit Number 2   Number of Visits 9   Date for SLP Re-Evaluation 12/06/15   SLP Start Time 0900   SLP Stop Time  1000   SLP Time Calculation (min) 60 min   Activity Tolerance Patient tolerated treatment well      Past Medical History  Diagnosis Date  . Diabetes mellitus without complication [facility])     Past Surgical History  Procedure Laterality Date  . Replacement total knee bilateral      There were no vitals filed for this visit.      Subjective Assessment - 11/09/15 1714    Subjective Anita Lee is pleased to have materials she can use at home to continue improving her language skills. She feels more confident in her ability to correct herself when she makes an error and is prepared to have her communication partners wait for her to solve the breakdown.   Currently in Pain? No/denies               ADULT SLP TREATMENT - 11/09/15 0001    Treatment Provided   Treatment provided Cognitive-Linquistic   Pain Assessment   Pain Assessment No/denies pain   Cognitive-Linquistic Treatment   Treatment focused on Aphasia   Skilled Treatment Take-home exercises were provided and explained. Anita Lee was able to select which exercises she felt would be most beneficial to her. She has met her goals at this point.   Assessment / Recommendations / Plan   Plan Discharge SLP treatment due to (comment)   Progression Toward Goals   Progression toward goals Goals met, education completed, patient discharged from [place] Education - 11/09/15 1715    Education  Details Activities/ games for home practice   Person(s) Educated Patient   Methods Explanation   Comprehension Verbalized understanding            SLP Long Term Goals - 11/09/15 1723    SLP LONG TERM GOAL #1   Title Client will participate in developing a home exercise program for communication   Time 4   Period Weeks   Status Achieved          Plan - 11/09/15 1715    Clinical Impression Statement Take-home exercises, including analogies, synonyms, and titles of helpful workbooks and websites, were provided and explained. Anita Lee was able to select which exercises she felt would be most beneficial to her to improve her communication at home. She is able to self-monitor relatively well, and is aware of her remaining deficits, both of which are positive indicators for continued success outside of the therapy environment. She has met her goals at this point.   Speech Therapy Frequency 2x / week   Duration 4 weeks   Treatment/Interventions Multimodal communcation approach;Patient/family education   Potential to Achieve Goals Good   Potential Considerations Ability to learn/carryover information;Co-morbidities;Cooperation/participation level;Medical prognosis;Previous level of function;Pain level;Severity of impairments;Financial resources;Family/community support   SLP Home Exercise Plan Exercises and materials provided   Consulted and Agree with Plan of Care Patient  Patient will benefit from skilled therapeutic intervention in order to improve the following deficits and impairments:   Aphasia    Problem List Patient Active Problem List   Diagnosis Date Noted  . Intracerebral hematoma ([place])   . Diabetes mellitus type 2 in obese ([place])   . Status post left knee replacement   . Status post right knee replacement   . Bradycardia   . AKI (acute kidney injury) ([employer])   . Leukocytosis   . TBI (traumatic brain injury) ([place]) 09/10/2015    Anita Lee 11/09/2015, 5:26  PM  [place] MAIN [facility] SERVICES [address] [place], [place], [address] Phone: [phone]   Fax:  [phone]   Name: Anita Lee MRN: [id number] Date of Birth: Nov 05, 1950

## 2015-11-10 DIAGNOSIS — R4701 Aphasia: Secondary | ICD-10-CM | POA: Diagnosis not present

## 2015-11-13 ENCOUNTER — Ambulatory Visit: Payer: Medicare HMO | Admitting: Speech Pathology

## 2015-11-15 ENCOUNTER — Encounter: Payer: No Typology Code available for payment source | Admitting: Speech Pathology

## 2015-11-20 ENCOUNTER — Encounter: Payer: No Typology Code available for payment source | Admitting: Speech Pathology

## 2015-11-22 ENCOUNTER — Encounter: Payer: No Typology Code available for payment source | Admitting: Speech Pathology

## 2015-11-27 ENCOUNTER — Encounter: Payer: No Typology Code available for payment source | Admitting: Speech Pathology

## 2015-11-29 ENCOUNTER — Encounter: Payer: No Typology Code available for payment source | Admitting: Speech Pathology

## 2015-11-30 ENCOUNTER — Encounter: Payer: No Typology Code available for payment source | Admitting: Speech Pathology

## 2015-12-04 ENCOUNTER — Encounter: Payer: No Typology Code available for payment source | Admitting: Speech Pathology

## 2015-12-06 ENCOUNTER — Encounter: Payer: No Typology Code available for payment source | Admitting: Speech Pathology

## 2016-06-20 IMAGING — CT CT HEAD W/O CM
1 of 2 series · 14 of 30 positions shown, 18 images · non-contrast
Comparison: 09/11/2015 and 09/10/15.

CLINICAL DATA: 64-year-old female with intracranial hemorrhage.
Recently tripped and fell hitting head on car. Subsequent encounter.

EXAM:
CT HEAD WITHOUT CONTRAST
TECHNIQUE: Contiguous axial images were obtained from the base of the skull
through the vertex without intravenous contrast.

[Series 3: head 2.0 h70h · axial · 0.45mm/px · z∈[-192,-52]mm · 14 of 78 slices shown, 18 images]
[im 4/78  brain]
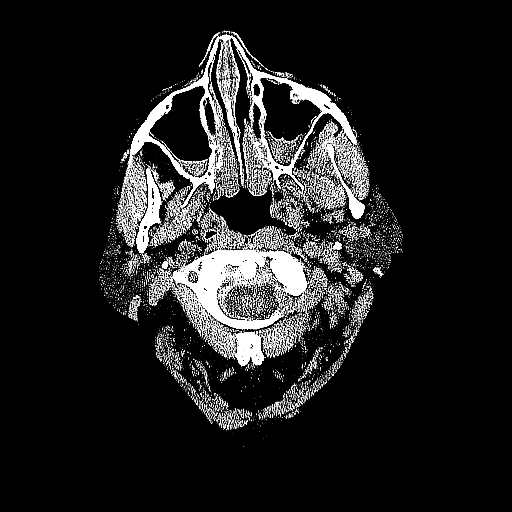
[im 4/78  bone]
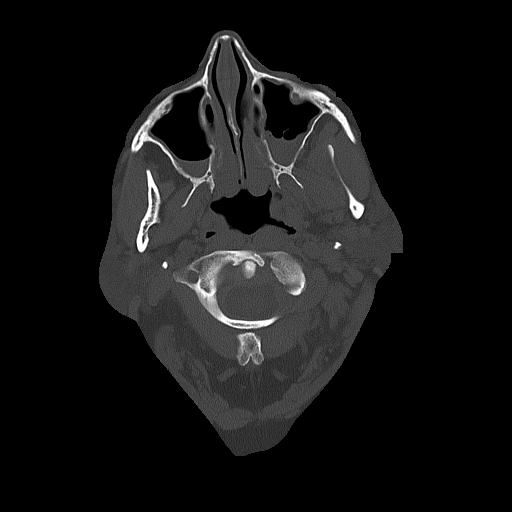
[im 12/78  brain]
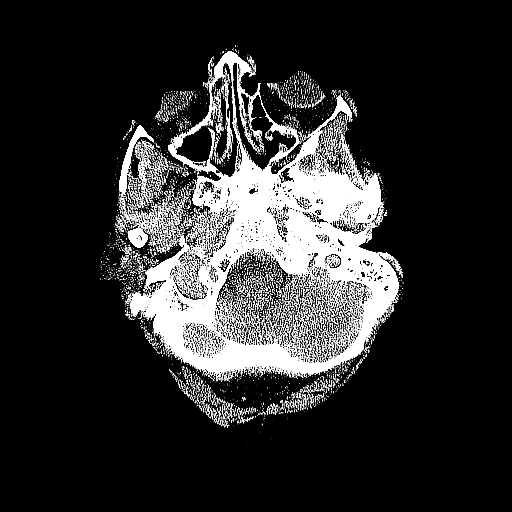
[im 16/78  brain]
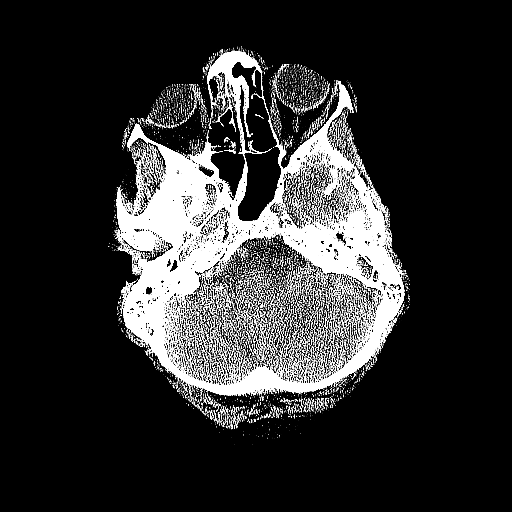
[im 20/78  brain]
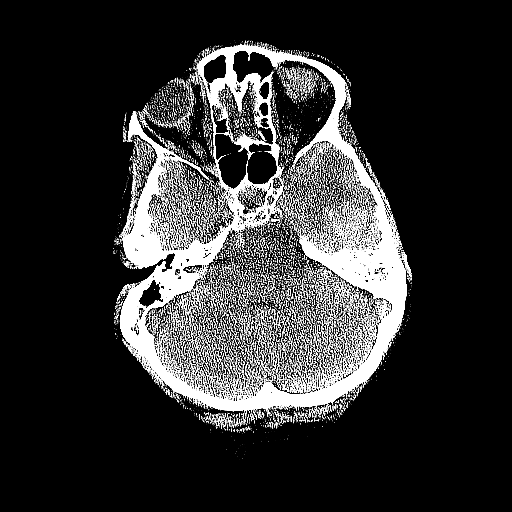
[im 27/78  brain]
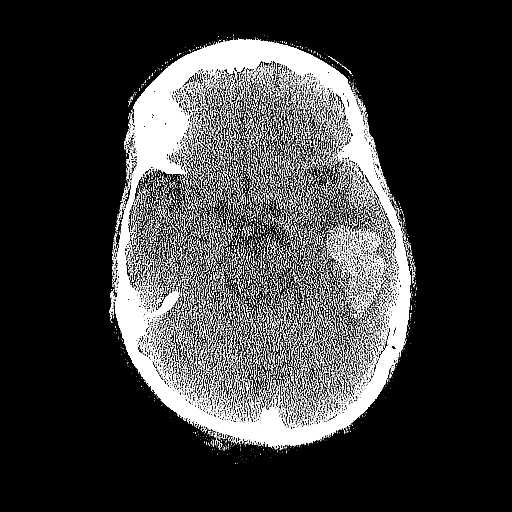
[im 27/78  bone]
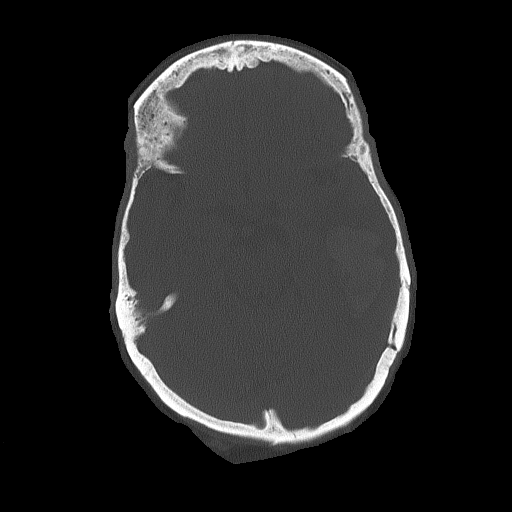
[im 31/78  brain]
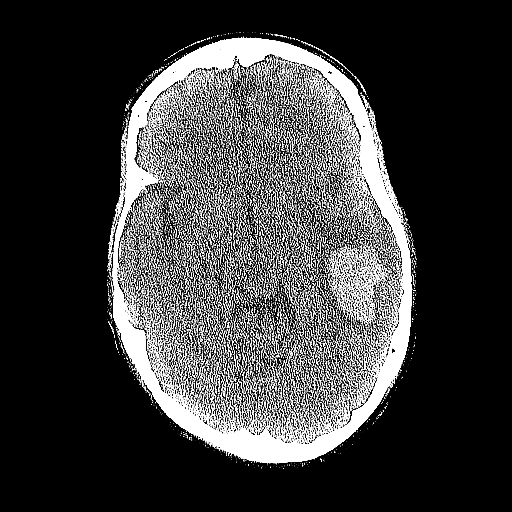
[im 35/78  brain]
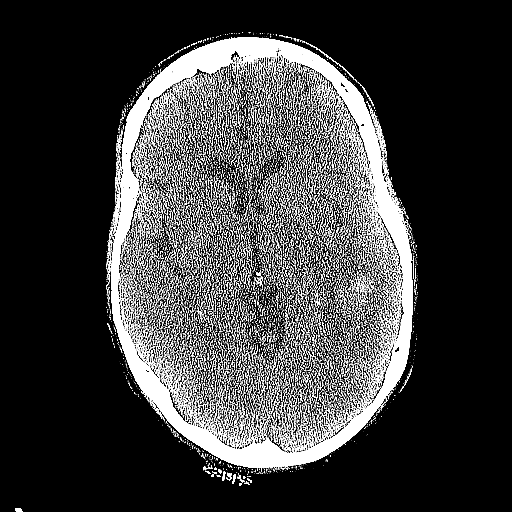
[im 43/78  brain]
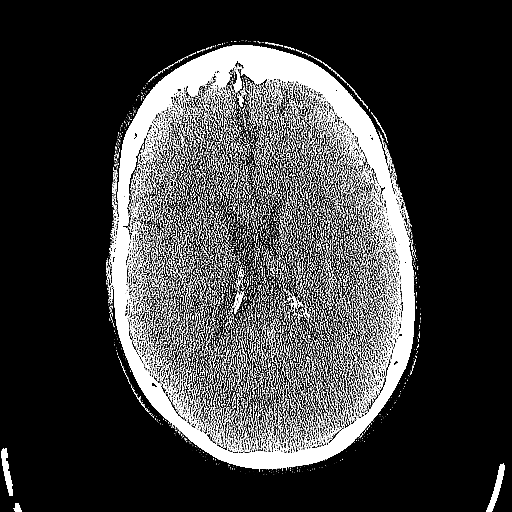
[im 47/78  brain]
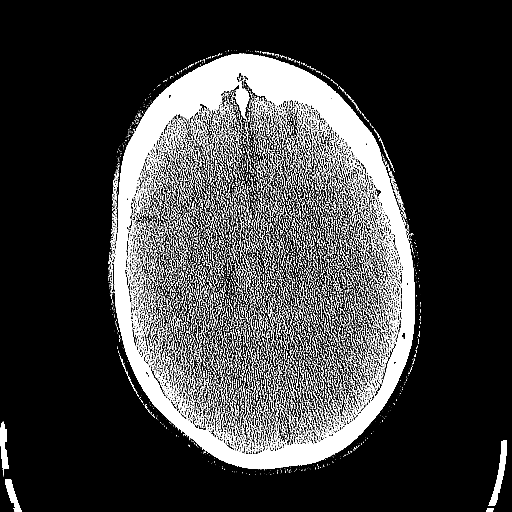
[im 47/78  bone]
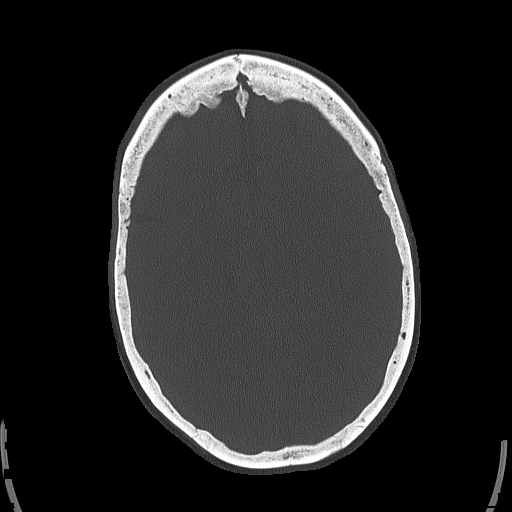
[im 51/78  brain]
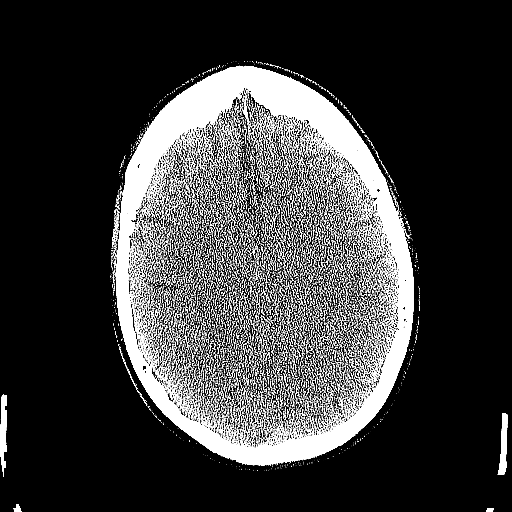
[im 58/78  brain]
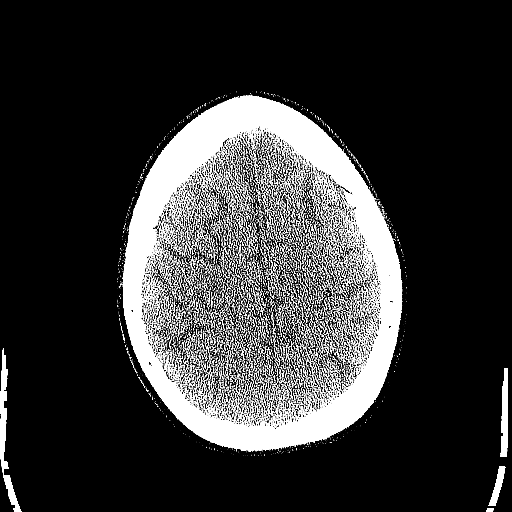
[im 62/78  brain]
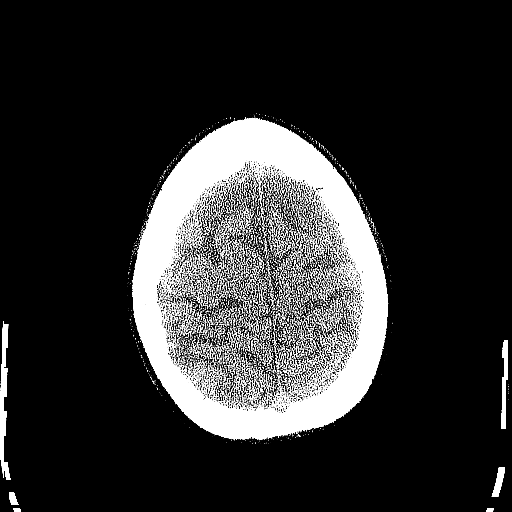
[im 66/78  brain]
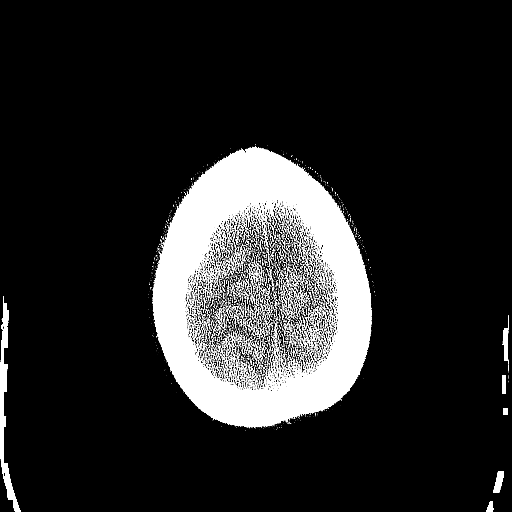
[im 66/78  bone]
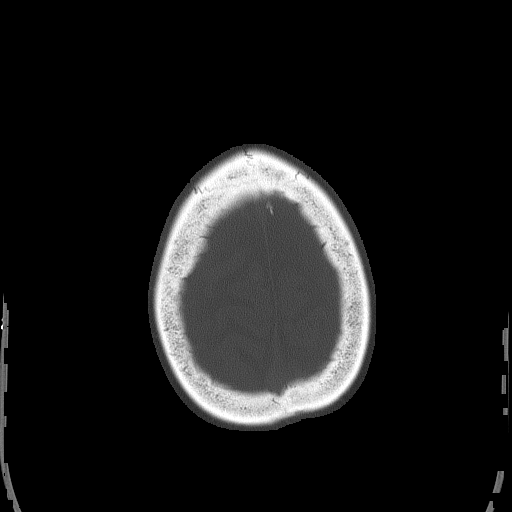
[im 74/78  brain]
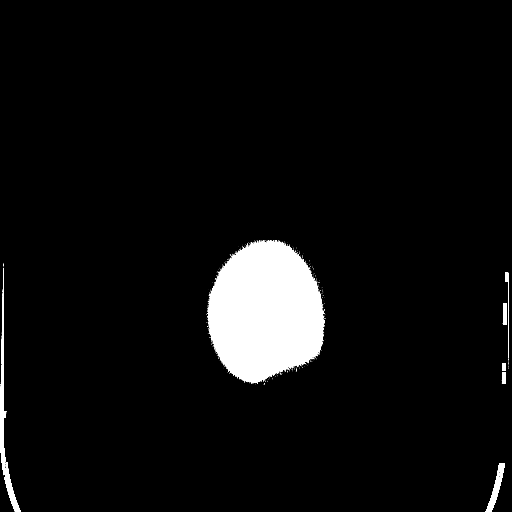

[14 of 30 positions shown; findings below may reference images not displayed]

FINDINGS: No significant change in appearance of large left temporal lobe
hematoma and surrounding vasogenic edema. This measures 5.7 x 2.6 x
3.5 cm. Mild local mass effect.

No new intracranial hemorrhage or CT evidence of large acute
thrombotic infarct.

Opacification mastoid air cells and middle ear cavities bilaterally
greater on left. No discrete findings to suggest temporal bone
fracture. If patient had acute onset of hearing difficulties onset
from fall than petrous temporal bone CT can be obtained for further
delineation.

Right parietal-occipital scalp hematoma without underlying fracture.

Mucosal thickening maxillary sinuses with air-fluid level. No
definitive evidence of orbital floor injury. If this were of concern
CT orbits can be obtained. Partial opacification right ethmoid sinus
air cells and partial opacification right sphenoid sinus air cell.

Prominent sutures without discrete diastases to suggest acute
injury.
IMPRESSION: Overall no significant change from 09/11/2015.

No significant change in appearance of large left temporal lobe
hematoma and surrounding vasogenic edema. This measures 5.7 x 2.6 x
3.5 cm. Mild local mass effect.

No new intracranial hemorrhage or CT evidence of large acute
thrombotic infarct.

Opacification mastoid air cells and middle ear cavities bilaterally
greater on left. No discrete findings to suggest temporal bone
fracture. If patient had acute onset of hearing difficulties onset
from fall than petrous temporal bone CT can be obtained for further
delineation.

Right parietal-occipital scalp hematoma without underlying fracture.

Mucosal thickening maxillary sinuses with air-fluid level. No
definitive evidence of orbital floor injury. If this were of concern
CT orbits can be obtained. Partial opacification right ethmoid sinus
air cells and partial opacification right sphenoid sinus air cell.

## 2022-03-21 ENCOUNTER — Encounter: Payer: Self-pay | Admitting: Ophthalmology

## 2022-03-21 NOTE — Discharge Instructions (Signed)

## 2022-03-26 ENCOUNTER — Ambulatory Visit: Payer: Medicare HMO | Admitting: Anesthesiology

## 2022-03-26 ENCOUNTER — Ambulatory Visit
Admission: RE | Admit: 2022-03-26 | Discharge: 2022-03-26 | Disposition: A | Discharge status: Home / Self Care | Payer: Medicare HMO | Attending: Ophthalmology | Admitting: Ophthalmology

## 2022-03-26 ENCOUNTER — Encounter
Admission: RE | Disposition: A | Discharge status: Home / Self Care | Payer: Self-pay | Source: Home / Self Care | Attending: Ophthalmology

## 2022-03-26 ENCOUNTER — Encounter: Payer: Self-pay | Admitting: Ophthalmology

## 2022-03-26 ENCOUNTER — Other Ambulatory Visit: Payer: Self-pay

## 2022-03-26 DIAGNOSIS — H401111 Primary open-angle glaucoma, right eye, mild stage: Secondary | ICD-10-CM | POA: Diagnosis not present

## 2022-03-26 DIAGNOSIS — H2511 Age-related nuclear cataract, right eye: Secondary | ICD-10-CM | POA: Diagnosis present

## 2022-03-26 DIAGNOSIS — E1136 Type 2 diabetes mellitus with diabetic cataract: Secondary | ICD-10-CM | POA: Insufficient documentation

## 2022-03-26 DIAGNOSIS — Z79899 Other long term (current) drug therapy: Secondary | ICD-10-CM | POA: Diagnosis not present

## 2022-03-26 DIAGNOSIS — E119 Type 2 diabetes mellitus without complications: Secondary | ICD-10-CM | POA: Diagnosis not present

## 2022-03-26 DIAGNOSIS — Z7984 Long term (current) use of oral hypoglycemic drugs: Secondary | ICD-10-CM | POA: Diagnosis not present

## 2022-03-26 DIAGNOSIS — I10 Essential (primary) hypertension: Secondary | ICD-10-CM | POA: Diagnosis not present

## 2022-03-26 DIAGNOSIS — Z87891 Personal history of nicotine dependence: Secondary | ICD-10-CM | POA: Insufficient documentation

## 2022-03-26 HISTORY — PX: CATARACT EXTRACTION W/PHACO: SHX586

## 2022-03-26 SURGERY — PHACOEMULSIFICATION, CATARACT, WITH IOL INSERTION
Anesthesia: Monitor Anesthesia Care | Site: Eye | Laterality: Right

## 2022-03-26 MED ORDER — MIDAZOLAM HCL 2 MG/2ML IJ SOLN
INTRAMUSCULAR | Status: DC | PRN
  Administered 2022-03-26: 2 mg via INTRAVENOUS

## 2022-03-26 MED ORDER — MOXIFLOXACIN HCL 0.5 % OP SOLN
OPHTHALMIC | Status: DC | PRN
  Administered 2022-03-26: 0.2 mL via OPHTHALMIC

## 2022-03-26 MED ORDER — LIDOCAINE HCL (PF) 2 % IJ SOLN
INTRAOCULAR | Status: DC | PRN
  Administered 2022-03-26: 4 mL via INTRAOCULAR

## 2022-03-26 MED ORDER — ARMC OPHTHALMIC DILATING DROPS
1.0000 | OPHTHALMIC | Status: DC | PRN
  Administered 2022-03-26 (×3): 1 via OPHTHALMIC

## 2022-03-26 MED ORDER — SIGHTPATH DOSE#1 BSS IO SOLN
INTRAOCULAR | Status: DC | PRN
  Administered 2022-03-26: 90 mL via OPHTHALMIC

## 2022-03-26 MED ORDER — FENTANYL CITRATE PF 50 MCG/ML IJ SOSY: 25.0000 ug | PREFILLED_SYRINGE | INTRAMUSCULAR | Status: DC | PRN

## 2022-03-26 MED ORDER — TETRACAINE HCL 0.5 % OP SOLN
1.0000 [drp] | OPHTHALMIC | Status: DC | PRN
  Administered 2022-03-26 (×2): 1 [drp] via OPHTHALMIC

## 2022-03-26 MED ORDER — SIGHTPATH DOSE#1 BSS IO SOLN
INTRAOCULAR | Status: DC | PRN
  Administered 2022-03-26: 15 mL via INTRAOCULAR

## 2022-03-26 MED ORDER — FENTANYL CITRATE (PF) 100 MCG/2ML IJ SOLN
INTRAMUSCULAR | Status: DC | PRN
  Administered 2022-03-26: 100 ug via INTRAVENOUS

## 2022-03-26 MED ORDER — SIGHTPATH DOSE#1 NA HYALUR & NA CHOND-NA HYALUR IO KIT
PACK | INTRAOCULAR | Status: DC | PRN
  Administered 2022-03-26: 1 via OPHTHALMIC

## 2022-03-26 MED ORDER — LACTATED RINGERS IV SOLN: INTRAVENOUS | Status: DC

## 2022-03-26 MED ORDER — BRIMONIDINE TARTRATE-TIMOLOL 0.2-0.5 % OP SOLN
OPHTHALMIC | Status: DC | PRN
  Administered 2022-03-26: 1 [drp] via OPHTHALMIC

## 2022-03-26 MED ORDER — ONDANSETRON HCL 4 MG/2ML IJ SOLN: 4.0000 mg | Freq: Once | INTRAMUSCULAR | Status: DC | PRN

## 2022-03-26 SURGICAL SUPPLY — 15 items
BLADE DUAL KAHOOK SINGLE USE (BLADE) IMPLANT
CANNULA ANT/CHMB 27G (MISCELLANEOUS) IMPLANT
CANNULA ANT/CHMB 27GA (MISCELLANEOUS) IMPLANT
CATARACT SUITE SIGHTPATH (MISCELLANEOUS) ×1 IMPLANT
DISSECTOR HYDRO NUCLEUS 50X22 (MISCELLANEOUS) ×1 IMPLANT
DRSG TEGADERM 2-3/8X2-3/4 SM (GAUZE/BANDAGES/DRESSINGS) ×1 IMPLANT
FEE CATARACT SUITE SIGHTPATH (MISCELLANEOUS) ×1 IMPLANT
GLOVE SURG SYN 7.5  E (GLOVE) ×1
GLOVE SURG SYN 7.5 E (GLOVE) ×1 IMPLANT
GLOVE SURG SYN 7.5 PF PI (GLOVE) ×1 IMPLANT
GLOVE SURG SYN 8.5  E (GLOVE) ×1
GLOVE SURG SYN 8.5 E (GLOVE) ×1 IMPLANT
GLOVE SURG SYN 8.5 PF PI (GLOVE) ×1 IMPLANT
LENS IOL TECNIS EYHANCE 17.5 (Intraocular Lens) IMPLANT
WATER STERILE IRR 250ML POUR (IV SOLUTION) ×1 IMPLANT

## 2022-03-26 NOTE — H&P (Signed)
[facility]   Primary Care Physician:  Sofie Hartigan, MD Ophthalmologist: Dr. Merleen Nicely  Pre-Procedure History & Physical: HPI:  Anita Lee is a 71 y.o. female here for cataract surgery and goniotomy.   Past Medical History:  Diagnosis Date   Pre-diabetes     Past Surgical History:  Procedure Laterality Date   ABDOMINAL HYSTERECTOMY     REPLACEMENT TOTAL KNEE BILATERAL      Prior to Admission medications   Medication Sig Start Date End Date Taking? Authorizing Provider  allopurinol (ZYLOPRIM) 300 MG tablet Take 300 mg by mouth daily.   Yes [provider]  colchicine 0.6 MG tablet Take 0.6 mg by mouth 2 (two) times daily.   Yes [provider]  escitalopram (LEXAPRO) 10 MG tablet Take 10 mg by mouth daily.   Yes [provider]  levocetirizine (XYZAL) 5 MG tablet Take 5 mg by mouth at bedtime as needed for allergies.   Yes [provider]  lisinopril (ZESTRIL) 2.5 MG tablet Take 2.5 mg by mouth daily.   Yes [provider]  metFORMIN (GLUCOPHAGE) 500 MG tablet Take 500 mg by mouth at bedtime.   Yes [provider]  pravastatin (PRAVACHOL) 40 MG tablet Take 40 mg by mouth daily.   Yes [provider]  Pseudoeph-Doxylamine-DM-APAP (NYQUIL PO) Take by mouth as needed.   Yes [provider]  triamterene-hydrochlorothiazide (DYAZIDE) 37.5-25 MG capsule Take 1 capsule by mouth daily.   Yes [provider]    Allergies as of 03/15/2022   (No Known Allergies)    History reviewed. No pertinent family history.  Social History   Socioeconomic History   Marital status: Married    Spouse name: Not on file   Number of children: Not on file   Years of education: Not on file   Highest education level: Not on file  Occupational History   Not on file  Tobacco Use   Smoking status: Former    Types: Cigarettes    Quit date: 05/28/1979    Years since quitting: 42.8   Smokeless  tobacco: Never   Tobacco comments:    Smoked some college  Vaping Use   Vaping Use: Never used  Substance and Sexual Activity   Alcohol use: No    Comment: Rare   Drug use: Not on file   Sexual activity: Not on file  Other Topics Concern   Not on file  Social History Narrative   Not on file   Social Determinants of Health   Financial Resource Strain: Not on file  Food Insecurity: Not on file  Transportation Needs: Not on file  Physical Activity: Not on file  Stress: Not on file  Social Connections: Not on file  Intimate Partner Violence: Not on file    Review of Systems: See HPI, otherwise negative ROS  Physical Exam: Ht 5\' 2"  (1.575 m)   Wt 85.3 kg   BMI 34.39 kg/m  General:   Alert, cooperative in NAD Head:  Normocephalic and atraumatic. Respiratory:  Normal work of breathing. Cardiovascular:  RRR  Impression/Plan: Anita Lee is here for cataract surgery and goniotomy.  Risks, benefits, limitations, and alternatives regarding cataract surgery and goniotomy have been reviewed with the patient.  Questions have been answered.  All parties agreeable.   Norvel Richards, MD  03/26/2022, 7:21 AM

## 2022-03-26 NOTE — Transfer of Care (Signed)
Immediate Anesthesia Transfer of Care Note  Patient: Anita Lee  Procedure(s) Performed: CATARACT EXTRACTION PHACO AND INTRAOCULAR LENS PLACEMENT (IOC) RIGHT KDB GONIOTOMY 3.76 00:40.7 (Right: Eye)  Patient Location: PACU  Anesthesia Type: MAC  Level of Consciousness: awake, alert  and patient cooperative  Airway and Oxygen Therapy: Patient Spontanous Breathing and Patient connected to supplemental oxygen  Post-op Assessment: Post-op Vital signs reviewed, Patient's Cardiovascular Status Stable, Respiratory Function Stable, Patent Airway and No signs of Nausea or vomiting  Post-op Vital Signs: Reviewed and stable  Complications: There were no known notable events for this encounter.

## 2022-03-26 NOTE — Op Note (Signed)
PREOPERATIVE DIAGNOSIS:  Nuclear sclerotic cataract  right eye. 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 Item Serial No. Manufacturer Lot No. LRB No. Used Action  LENS IOL TECNIS EYHANCE 17.5 - [id number] Intraocular Lens LENS IOL TECNIS EYHANCE 17.5 [id number] [employer]  Right 1 Implanted    SURGEON:  Merleen Nicely, MD   ANESTHESIA:  Topical with tetracaine drops augmented with 1% preservative-free intracameral lidocaine.    COMPLICATIONS:  None.   DESCRIPTION OF PROCEDURE:  The patient was identified in the holding room and transported to the operating room and placed in the supine position under the operating microscope.  The right eye was identified as the operative eye and it was prepped and draped in the usual sterile ophthalmic fashion.   A 1 millimeter clear-corneal paracentesis was made at the superotemporal position.  0.5 ml of preservative-free 1% lidocaine was injected into the anterior chamber.  The anterior chamber was filled with Viscoat viscoelastic.  A 2.4 millimeter keratome was used to make a near-clear corneal incision at the inferotemporal position. A curvilinear capsulorrhexis was made with a cystotome and capsulorrhexis forceps. The microscope was adjusted and a gonioprism was used to visulaize the trabecular meshwork.  [facility] Dual Blade was advanced across the anterior chamber under viscoelastic.  The blade was used to mark the trabecular meshwork at the 1:30 position.  The blade was placed two clock hours  clockwise into the meshwork.  Proper postioning was confirmed.  The blade was passed counterclockwise through the meshwork to excise approximately two to three clock-hours of trabecular meshwork. The microscope was adjusted back to primary position.  Balanced salt solution was used to hydrodissect and hydrodelineate the nucleus. Phacoemulsification was then used to remove the lens nucleus and epinucleus.  The remaining cortex was then removed using the irrigation and aspiration handpiece. Provisc was then placed into the capsular bag to distend it for lens placement.  A +17.50 D [id number] lens was then injected into the capsular bag.  The remaining viscoelastic was aspirated.   Wounds were hydrated with balanced salt solution.  The anterior chamber was inflated to a physiologic pressure with balanced salt solution. Vigamox was injected intracamerally. No wound leaks were noted.  The patient was taken to the recovery room in stable condition without complications of anesthesia or surgery.

## 2022-03-26 NOTE — Anesthesia Postprocedure Evaluation (Signed)
Anesthesia Post Note  Patient: Makell Drohan  Procedure(s) Performed: CATARACT EXTRACTION PHACO AND INTRAOCULAR LENS PLACEMENT (IOC) RIGHT KDB GONIOTOMY 3.76 00:40.7 (Right: Eye)  Patient location during evaluation: PACU Anesthesia Type: MAC Level of consciousness: awake and alert Pain management: pain level controlled Vital Signs Assessment: post-procedure vital signs reviewed and stable Respiratory status: spontaneous breathing, nonlabored ventilation, respiratory function stable and patient connected to nasal cannula oxygen Cardiovascular status: stable and blood pressure returned to baseline Postop Assessment: no apparent nausea or vomiting Anesthetic complications: no   There were no known notable events for this encounter.   Last Vitals:  Vitals:   03/26/22 1130 03/26/22 1135  BP: 119/62 132/66  Pulse: 73 75  Resp: 11 16  Temp: (!) 36.1 C   SpO2: 99% 97%    Last Pain:  Vitals:   03/26/22 1130  TempSrc:   PainSc: 0-No pain                 Molli Barrows

## 2022-03-26 NOTE — Anesthesia Preprocedure Evaluation (Signed)
Anesthesia Evaluation  Patient identified by MRN, date of birth, ID band Patient awake    Reviewed: Allergy & Precautions, H&P , NPO status , Patient's Chart, lab work & pertinent test results, reviewed documented beta blocker date and time   Airway Mallampati: II  TM Distance: >3 FB Neck ROM: full    Dental no notable dental hx. (+) Teeth Intact   Pulmonary neg pulmonary ROS, former smoker,    Pulmonary exam normal breath sounds clear to auscultation       Cardiovascular Exercise Tolerance: Poor hypertension, On Medications negative cardio ROS   Rhythm:regular Rate:Normal     Neuro/Psych negative neurological ROS  negative psych ROS   GI/Hepatic negative GI ROS, Neg liver ROS,   Endo/Other  negative endocrine ROSdiabetes, Well Controlled  Renal/GU Renal disease     Musculoskeletal   Abdominal   Peds  Hematology negative hematology ROS (+)   Anesthesia Other Findings   Reproductive/Obstetrics negative OB ROS                             Anesthesia Physical Anesthesia Plan  ASA: 2  Anesthesia Plan: MAC   Post-op Pain Management:    Induction:   PONV Risk Score and Plan:   Airway Management Planned:   Additional Equipment:   Intra-op Plan:   Post-operative Plan:   Informed Consent: I have reviewed the patients History and Physical, chart, labs and discussed the procedure including the risks, benefits and alternatives for the proposed anesthesia with the patient or authorized representative who has indicated his/her understanding and acceptance.       Plan Discussed with: CRNA  Anesthesia Plan Comments:         Anesthesia Quick Evaluation

## 2024-05-04 NOTE — Discharge Instructions (Signed)

## 2024-05-05 ENCOUNTER — Encounter: Payer: Self-pay | Admitting: Ophthalmology

## 2024-05-06 ENCOUNTER — Encounter: Admission: RE | Disposition: A | Payer: Self-pay | Source: Home / Self Care | Attending: Ophthalmology

## 2024-05-06 ENCOUNTER — Other Ambulatory Visit: Payer: Self-pay

## 2024-05-06 ENCOUNTER — Ambulatory Visit
Admission: RE | Admit: 2024-05-06 | Discharge: 2024-05-06 | Disposition: A | Attending: Ophthalmology | Admitting: Ophthalmology

## 2024-05-06 ENCOUNTER — Encounter: Payer: Self-pay | Admitting: Ophthalmology

## 2024-05-06 ENCOUNTER — Ambulatory Visit: Admitting: Anesthesiology

## 2024-05-06 DIAGNOSIS — E1136 Type 2 diabetes mellitus with diabetic cataract: Secondary | ICD-10-CM | POA: Diagnosis present

## 2024-05-06 DIAGNOSIS — H2512 Age-related nuclear cataract, left eye: Secondary | ICD-10-CM | POA: Diagnosis not present

## 2024-05-06 DIAGNOSIS — Z7984 Long term (current) use of oral hypoglycemic drugs: Secondary | ICD-10-CM | POA: Diagnosis not present

## 2024-05-06 DIAGNOSIS — Z87891 Personal history of nicotine dependence: Secondary | ICD-10-CM | POA: Diagnosis not present

## 2024-05-06 DIAGNOSIS — I10 Essential (primary) hypertension: Secondary | ICD-10-CM | POA: Diagnosis not present

## 2024-05-06 DIAGNOSIS — Z79899 Other long term (current) drug therapy: Secondary | ICD-10-CM | POA: Diagnosis not present

## 2024-05-06 HISTORY — PX: CATARACT EXTRACTION W/PHACO: SHX586

## 2024-05-06 LAB — GLUCOSE, CAPILLARY: Glucose-Capillary: 148 mg/dL — ABNORMAL HIGH (ref 70–99)

## 2024-05-06 SURGERY — PHACOEMULSIFICATION, CATARACT, WITH IOL INSERTION
Anesthesia: Topical | Laterality: Left

## 2024-05-06 MED ORDER — MOXIFLOXACIN HCL 0.5 % OP SOLN
OPHTHALMIC | Status: DC | PRN
  Administered 2024-05-06: .2 mL via OPHTHALMIC

## 2024-05-06 MED ORDER — SIGHTPATH DOSE#1 BSS IO SOLN
INTRAOCULAR | Status: DC | PRN
  Administered 2024-05-06: 60 mL via OPHTHALMIC

## 2024-05-06 MED ORDER — CYCLOPENTOLATE HCL 2 % OP SOLN
1.0000 [drp] | OPHTHALMIC | Status: DC
  Administered 2024-05-06 (×2): 1 [drp] via OPHTHALMIC

## 2024-05-06 MED ORDER — PHENYLEPHRINE HCL 10 % OP SOLN
OPHTHALMIC | Status: AC
  Filled 2024-05-06: qty 5

## 2024-05-06 MED ORDER — TETRACAINE HCL 0.5 % OP SOLN
OPHTHALMIC | Status: AC
  Filled 2024-05-06: qty 4

## 2024-05-06 MED ORDER — FENTANYL CITRATE (PF) 100 MCG/2ML IJ SOLN
INTRAMUSCULAR | Status: DC | PRN
  Administered 2024-05-06 (×2): 50 ug via INTRAVENOUS

## 2024-05-06 MED ORDER — SIGHTPATH DOSE#1 NA HYALUR & NA CHOND-NA HYALUR IO KIT
PACK | INTRAOCULAR | Status: DC | PRN
  Administered 2024-05-06: 1 via OPHTHALMIC

## 2024-05-06 MED ORDER — TETRACAINE HCL 0.5 % OP SOLN
1.0000 [drp] | OPHTHALMIC | Status: DC | PRN
  Administered 2024-05-06 (×3): 1 [drp] via OPHTHALMIC

## 2024-05-06 MED ORDER — LIDOCAINE HCL (PF) 2 % IJ SOLN
INTRAOCULAR | Status: DC | PRN
  Administered 2024-05-06: 1 mL via INTRAOCULAR

## 2024-05-06 MED ORDER — MIDAZOLAM HCL 2 MG/2ML IJ SOLN
INTRAMUSCULAR | Status: AC
  Filled 2024-05-06: qty 2

## 2024-05-06 MED ORDER — BRIMONIDINE TARTRATE-TIMOLOL 0.2-0.5 % OP SOLN
OPHTHALMIC | Status: DC | PRN
  Administered 2024-05-06: 1 [drp] via OPHTHALMIC

## 2024-05-06 MED ORDER — CYCLOPENTOLATE HCL 2 % OP SOLN
OPHTHALMIC | Status: AC
  Filled 2024-05-06: qty 2

## 2024-05-06 MED ORDER — MIDAZOLAM HCL (PF) 2 MG/2ML IJ SOLN
INTRAMUSCULAR | Status: DC | PRN
  Administered 2024-05-06: 2 mg via INTRAVENOUS

## 2024-05-06 MED ORDER — PHENYLEPHRINE HCL 10 % OP SOLN
1.0000 [drp] | OPHTHALMIC | Status: DC
  Administered 2024-05-06 (×2): 1 [drp] via OPHTHALMIC

## 2024-05-06 MED ORDER — FENTANYL CITRATE (PF) 100 MCG/2ML IJ SOLN
INTRAMUSCULAR | Status: AC
  Filled 2024-05-06: qty 2

## 2024-05-06 MED ORDER — SIGHTPATH DOSE#1 BSS IO SOLN
INTRAOCULAR | Status: DC | PRN
  Administered 2024-05-06: 15 mL via INTRAOCULAR

## 2024-05-06 SURGICAL SUPPLY — 10 items
DISSECTOR HYDRO NUCLEUS 50X22 (MISCELLANEOUS) ×1 IMPLANT
DRSG TEGADERM 2-3/8X2-3/4 SM (GAUZE/BANDAGES/DRESSINGS) ×1 IMPLANT
FEE CATARACT SUITE SIGHTPATH (MISCELLANEOUS) ×1 IMPLANT
GLOVE BIOGEL PI IND STRL 8 (GLOVE) ×1 IMPLANT
GLOVE SURG LX STRL 7.5 STRW (GLOVE) ×1 IMPLANT
GLOVE SURG SYN 6.5 PF PI BL (GLOVE) ×1 IMPLANT
LENS IOL ENVISTA ASPR 20.5 (Intraocular Lens) IMPLANT
NDL FILTER BLUNT 18X1 1/2 (NEEDLE) ×1 IMPLANT
STRAP BODY AND KNEE 60X3 (MISCELLANEOUS) IMPLANT
SYR 3ML LL SCALE MARK (SYRINGE) ×1 IMPLANT

## 2024-05-06 NOTE — Transfer of Care (Signed)
 Immediate Anesthesia Transfer of Care Note  Patient: Anita Lee  Procedure(s) Performed: PHACOEMULSIFICATION, CATARACT, WITH IOL INSERTION 10.74, 01:07.1 (Left)  Patient Location: PACU  Anesthesia Type: MAC  Level of Consciousness: awake, alert  and patient cooperative  Airway and Oxygen Therapy: Patient Spontanous Breathing and Patient connected to supplemental oxygen  Post-op Assessment: Post-op Vital signs reviewed, Patient's Cardiovascular Status Stable, Respiratory Function Stable, Patent Airway and No signs of Nausea or vomiting  Post-op Vital Signs: Reviewed and stable  Complications: No notable events documented.

## 2024-05-06 NOTE — Anesthesia Postprocedure Evaluation (Signed)
 Anesthesia Post Note  Patient: Anita Lee  Procedure(s) Performed: PHACOEMULSIFICATION, CATARACT, WITH IOL INSERTION 10.74, 01:07.1 (Left)  Patient location during evaluation: PACU Anesthesia Type: MAC Level of consciousness: awake and alert Pain management: pain level controlled Vital Signs Assessment: post-procedure vital signs reviewed and stable Respiratory status: spontaneous breathing, nonlabored ventilation, respiratory function stable and patient connected to nasal cannula oxygen Cardiovascular status: stable and blood pressure returned to baseline Postop Assessment: no apparent nausea or vomiting Anesthetic complications: no   No notable events documented.   Last Vitals:  Vitals:   05/06/24 0906 05/06/24 0911  BP: (!) 160/93 (!) 160/102  Pulse: 62 61  Resp: 12 16  Temp: 36.6 C 36.6 C  SpO2: 96% 96%    Last Pain:  Vitals:   05/06/24 0911  TempSrc:   PainSc: 0-No pain                 [name]

## 2024-05-06 NOTE — H&P (Signed)
 [facility]   Primary Care Physician:  Jeffie Cheryl BRAVO, MD Ophthalmologist: Dr. Feliciano Ober  Pre-Procedure History & Physical: HPI:  Anita Lee is a 73 y.o. female here for cataract surgery.   Past Medical History:  Diagnosis Date   Pre-diabetes     Past Surgical History:  Procedure Laterality Date   ABDOMINAL HYSTERECTOMY     CATARACT EXTRACTION W/PHACO Right 03/26/2022   Procedure: CATARACT EXTRACTION PHACO AND INTRAOCULAR LENS PLACEMENT (IOC) RIGHT KDB GONIOTOMY 3.76 00:40.7;  Surgeon: Ober Feliciano Hugger, MD;  Location: [facility];  Service: Ophthalmology;  Laterality: Right;   REPLACEMENT TOTAL KNEE BILATERAL      Prior to Admission medications  Medication Sig Start Date End Date Taking? Authorizing Provider  allopurinol (ZYLOPRIM) 300 MG tablet Take 300 mg by mouth daily.   Yes [provider]  amLODipine (NORVASC) 5 MG tablet Take 5 mg by mouth daily.   Yes [provider]  colchicine 0.6 MG tablet Take 0.6 mg by mouth 2 (two) times daily.   Yes [provider]  escitalopram  (LEXAPRO ) 10 MG tablet Take 10 mg by mouth daily.   Yes [provider]  lisinopril (ZESTRIL) 2.5 MG tablet Take 2.5 mg by mouth daily.   Yes [provider]  metFORMIN  (GLUCOPHAGE ) 500 MG tablet Take 500 mg by mouth at bedtime.   Yes [provider]  pravastatin  (PRAVACHOL ) 40 MG tablet Take 40 mg by mouth daily.   Yes [provider]  Pseudoeph-Doxylamine-DM-APAP (NYQUIL PO) Take by mouth as needed.   Yes [provider]  levocetirizine (XYZAL) 5 MG tablet Take 5 mg by mouth at bedtime as needed for allergies. Patient not taking: Reported on 05/05/2024    [provider]  triamterene -hydrochlorothiazide  (DYAZIDE ) 37.5-25 MG capsule Take 1 capsule by mouth daily. Patient not taking: Reported on 05/05/2024    [provider]    Allergies as of 05/03/2024   (No Known Allergies)     History reviewed. No pertinent family history.  Social History   Socioeconomic History   Marital status: Married    Spouse name: Not on file   Number of children: Not on file   Years of education: Not on file   Highest education level: Not on file  Occupational History   Not on file  Tobacco Use   Smoking status: Former    Current packs/day: 0.00    Types: Cigarettes    Quit date: 05/28/1979    Years since quitting: 44.9   Smokeless tobacco: Never   Tobacco comments:    Smoked some college  Vaping Use   Vaping status: Never Used  Substance and Sexual Activity   Alcohol use: No    Comment: Rare   Drug use: Not on file   Sexual activity: Not on file  Other Topics Concern   Not on file  Social History Narrative   Not on file   Social Drivers of Health   Tobacco Use: Medium Risk (05/05/2024)   Patient History    Smoking Tobacco Use: Former    Smokeless Tobacco Use: Never    Passive Exposure: Not on file  Financial Resource Strain: Low Risk  (05/29/2023)   Received from [facility]   Overall Financial Resource Strain (CARDIA)    Difficulty of Paying Living Expenses: Not hard at all  Food Insecurity: No Food Insecurity (05/29/2023)   Received from [facility]   Epic    Within the  past 12 months, the food you bought just didn't last and you didn't have money to get more.: Never true    Within the past 12 months, you worried that your food would run out before you got the money to buy more.: Never true  Transportation Needs: No Transportation Needs (05/29/2023)   Received from [facility]   PRAPARE - Transportation    Lack of Transportation (Non-Medical): No    In the past 12 months, has lack of transportation kept you from medical appointments or from getting medications?: No  Physical Activity: Not on file  Stress: Not on file  Social Connections: Not on file  Intimate Partner Violence: Not on file  Depression  (EYV7-0): Not on file  Alcohol Screen: Not on file  Housing: Low Risk  (09/04/2023)   Received from [facility]   Epic    At any time in the past 12 months, were you homeless or living in a shelter (including now)?: No    In the past 12 months, how many times have you moved where you were living?: 0    In the last 12 months, was there a time when you were not able to pay the mortgage or rent on time?: No  Utilities: Not At Risk (05/29/2023)   Received from [facility] Utilities    Threatened with loss of utilities: No  Health Literacy: Not on file    Review of Systems: See HPI, otherwise negative ROS  Physical Exam: Ht 5' 3 (1.6 m)   Wt 90.7 kg   BMI 35.43 kg/m  General:   Alert, cooperative in NAD Head:  Normocephalic and atraumatic. Respiratory:  Normal work of breathing. Cardiovascular:  RRR  Impression/Plan: Anita Lee is here for cataract surgery.  Risks, benefits, limitations, and alternatives regarding cataract surgery have been reviewed with the patient.  Questions have been answered.  All parties agreeable.   Feliciano Bryan Ober, MD  05/06/2024, 7:14 AM

## 2024-05-06 NOTE — Op Note (Signed)
 OPERATIVE NOTE  Anita Lee [id number] 05/06/2024   PREOPERATIVE DIAGNOSIS: Nuclear sclerotic cataract left eye. H25.12   POSTOPERATIVE DIAGNOSIS: Nuclear sclerotic cataract left eye. H25.12   PROCEDURE:  Phacoemulsification with posterior chamber intraocular lens placement of the left eye  Ultrasound time: Procedures: PHACOEMULSIFICATION, CATARACT, WITH IOL INSERTION 10.74, 01:07.1 (Left)  LENS:   Implant Name Type Inv. Item Serial No. Manufacturer Lot No. LRB No. Used Action  LENS IOL ENVISTA ASPR 20.5 - [id number] Intraocular Lens LENS IOL ENVISTA ASPR 20.5 [id number] [facility] [id number] Left 1 Implanted      SURGEON:  Feliciano HERO. Enola, MD   ANESTHESIA:  Topical with tetracaine  drops, augmented with 1% preservative-free intracameral lidocaine .   COMPLICATIONS:  None.   DESCRIPTION OF PROCEDURE:  The patient was identified in the holding room and transported to the operating room and placed in the supine position under the operating microscope.  The left eye was identified as the operative eye, which was prepped and draped in the usual sterile ophthalmic fashion.   A 1 millimeter clear-corneal paracentesis was made inferotemporally. Preservative-free 1% lidocaine  mixed with 1:1,000 bisulfite-free aqueous solution of epinephrine  was injected into the anterior chamber. The anterior chamber was then filled with Viscoat viscoelastic. A 2.4 millimeter keratome was used to make a clear-corneal incision superotemporally. A curvilinear capsulorrhexis was made with a cystotome and capsulorrhexis forceps. Balanced salt  solution was used to hydrodissect and hydrodelineate the nucleus. Phacoemulsification was then used to remove the lens nucleus and epinucleus. The remaining cortex was then removed using the irrigation and aspiration handpiece. Provisc was then placed into the capsular bag to distend it for lens placement. A +20.50 D EA intraocular lens was then injected into the capsular  bag. The remaining viscoelastic was aspirated.   Wounds were hydrated with balanced salt  solution.  The anterior chamber was inflated to a physiologic pressure with balanced salt  solution.  No wound leaks were noted. Moxifloxacin  was injected intracamerally.  Timolol  and Brimonidine  drops were applied to the eye.  The patient was taken to the recovery room in stable condition without complications of anesthesia or surgery.  [employer] 05/06/2024, 9:05 AM

## 2024-05-06 NOTE — Anesthesia Preprocedure Evaluation (Signed)
 Anesthesia Evaluation  Patient identified by MRN, date of birth, ID band Patient awake    Reviewed: Allergy & Precautions, NPO status , Patient's Chart, lab work & pertinent test results  History of Anesthesia Complications Negative for: history of anesthetic complications  Airway Mallampati: III  TM Distance: >3 FB Neck ROM: full    Dental no notable dental hx.    Pulmonary neg pulmonary ROS, former smoker   Pulmonary exam normal        Cardiovascular hypertension, On Medications Normal cardiovascular exam     Neuro/Psych negative neurological ROS  negative psych ROS   GI/Hepatic negative GI ROS, Neg liver ROS,,,  Endo/Other  diabetes, Type 2, Oral Hypoglycemic Agents    Renal/GU negative Renal ROS  negative genitourinary   Musculoskeletal   Abdominal   Peds  Hematology negative hematology ROS (+)   Anesthesia Other Findings Past Medical History: No date: Pre-diabetes  Past Surgical History: No date: ABDOMINAL HYSTERECTOMY 03/26/2022: CATARACT EXTRACTION W/PHACO; Right     Comment:  Procedure: CATARACT EXTRACTION PHACO AND INTRAOCULAR               LENS PLACEMENT (IOC) RIGHT KDB GONIOTOMY 3.76 00:40.7;                Surgeon: Enola Feliciano Hugger, MD;  Location: [facility];  Service: Ophthalmology;  Laterality:               Right; No date: REPLACEMENT TOTAL KNEE BILATERAL  BMI    Body Mass Index: 36.85 kg/m      Reproductive/Obstetrics negative OB ROS                              Anesthesia Physical Anesthesia Plan  ASA: 3  Anesthesia Plan: MAC   Post-op Pain Management:    Induction: Intravenous  PONV Risk Score and Plan:   Airway Management Planned: Natural Airway and Nasal Cannula  Additional Equipment:   Intra-op Plan:   Post-operative Plan:   Informed Consent: I have reviewed the patients History and Physical, chart, labs and  discussed the procedure including the risks, benefits and alternatives for the proposed anesthesia with the patient or authorized representative who has indicated his/her understanding and acceptance.     Dental Advisory Given  Plan Discussed with: Anesthesiologist, CRNA and Surgeon  Anesthesia Plan Comments: (Patient consented for risks of anesthesia including but not limited to:  - adverse reactions to medications - damage to eyes, teeth, lips or other oral mucosa - nerve damage due to positioning  - sore throat or hoarseness - Damage to heart, brain, nerves, lungs, other parts of body or loss of life  Patient voiced understanding and assent.)        Anesthesia Quick Evaluation
